# Patient Record
Sex: Female | Born: 1970 | Race: White | Hispanic: No | Smoking: Never smoker
Health system: Southern US, Community
[De-identification: ages and names within clinical notes are randomized; demographics above are authoritative.]

## PROBLEM LIST (undated history)

## (undated) DIAGNOSIS — E559 Vitamin D deficiency, unspecified: Secondary | ICD-10-CM

## (undated) DIAGNOSIS — G4710 Hypersomnia, unspecified: Secondary | ICD-10-CM

## (undated) HISTORY — DX: Hypersomnia, unspecified: G47.10

## (undated) HISTORY — DX: Vitamin D deficiency, unspecified: E55.9

## (undated) HISTORY — PX: FRACTURE SURGERY: SHX138

---

## 2012-05-13 ENCOUNTER — Ambulatory Visit: Payer: Self-pay | Admitting: Family Medicine

## 2012-05-13 VITALS — BP 108/73 | HR 71 | Temp 98.2°F | Resp 16 | Ht 63.0 in | Wt 142.0 lb

## 2012-05-13 DIAGNOSIS — Z008 Encounter for other general examination: Secondary | ICD-10-CM

## 2012-05-13 DIAGNOSIS — R5383 Other fatigue: Secondary | ICD-10-CM

## 2012-05-13 DIAGNOSIS — Z0000 Encounter for general adult medical examination without abnormal findings: Secondary | ICD-10-CM

## 2012-05-13 DIAGNOSIS — R5381 Other malaise: Secondary | ICD-10-CM

## 2012-05-13 DIAGNOSIS — Z124 Encounter for screening for malignant neoplasm of cervix: Secondary | ICD-10-CM

## 2012-05-13 LAB — COMPREHENSIVE METABOLIC PANEL
Albumin: 4.1 g/dL (ref 3.5–5.2)
BUN: 11 mg/dL (ref 6–23)
Calcium: 9.2 mg/dL (ref 8.4–10.5)
Chloride: 104 mEq/L (ref 96–112)
Glucose, Bld: 81 mg/dL (ref 70–99)
Potassium: 4.1 mEq/L (ref 3.5–5.3)

## 2012-05-13 LAB — POCT URINALYSIS DIPSTICK
Bilirubin, UA: NEGATIVE
Blood, UA: NEGATIVE
Glucose, UA: NEGATIVE
Spec Grav, UA: <=1.005

## 2012-05-13 LAB — POCT CBC
HCT, POC: 42.5 % (ref 37.7–47.9)
Hemoglobin: 13.7 g/dL (ref 12.2–16.2)
MCH, POC: 30 pg (ref 27–31.2)
MPV: 9.6 fL (ref 0–99.8)
POC MID %: 6.3 %M (ref 0–12)
RBC: 4.57 M/uL (ref 4.04–5.48)
WBC: 6.3 10*3/uL (ref 4.6–10.2)

## 2012-05-13 LAB — LIPID PANEL
HDL: 51 mg/dL (ref >39)
Triglycerides: 189 mg/dL — ABNORMAL HIGH (ref <150)

## 2012-05-13 LAB — POCT UA - MICROSCOPIC ONLY
Bacteria, U Microscopic: NEGATIVE
Casts, Ur, LPF, POC: NEGATIVE
Mucus, UA: NEGATIVE

## 2012-05-13 NOTE — Patient Instructions (Addendum)
Eat less and exercise were to try and lose weight  If fatigue persists and labs are normal, return in a few months for a reassessment.

## 2012-05-13 NOTE — Progress Notes (Signed)
Complete physical examination  Patient is here for a complete physical examination. Is been several years since she's had one. She's been having problems with persistent fatigue over the past 4 years or so. She does not know of any significant health issues.  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 Moved in with her mother and mother's husband. Her mother has been in poor health. The patient had a significant other who had a heart attack one year ago at age 16. She is also divorced before that. Regional a lot. Works about 6 hours a day. Works from home.  Review of systems: Constitutional: Persistent fatigue or so the last several years. She's gained about 30 pounds over several years, 10 in the last year. HEENT: Vision is deteriorating a little bit but otherwise she took Cardiovascular: Unremarkable Respiratory: Unremarkable GI: Unremarkable. Her stomach has gotten bigger. GU unremarkable Musculoskeletal unremarkable Dermatologic unremarkable Neurologic unremarkable  Physical examination Mildly overweight white female no acute distress. TMs normal. Eyes PERRLA. Fundi benign. Throat clear. Neck supple without nodes or thyromegaly. No carotid bruits. Chest is clear to auscultation. Heart regular without murmurs gallops or arrhythmias. Has had bilateral breast implants, with very minimal scarring. No masses are appreciated. No axillary nodes. Soft without mass or tenderness. Normal external genitalia. Cervix is benign. Pap taken. Cervix was a little bit friable and bled with Pap smear. Bimanual exam reveals no adnexal or uterine masses. Extremities unremarkable. Skin unremarkable.  Assessment: physical examination Breast implants Persistent  fatigue Weight gain Major life stresses  Plan: Will do general labs for the fatigue. Also check STD testing per her request. She is not sexually active at this time. She has no insurance but was willing for me to going on the labs. Results for orders placed in visit on 05/13/12  POCT CBC      Component Value Range   WBC 6.3  4.6 - 10.2 K/uL   Lymph, poc 2.1  0.6 - 3.4   POC LYMPH PERCENT 33.4  10 - 50 %L   MID (cbc) 0.4  0 - 0.9   POC MID % 6.3  0 - 12 %M   POC Granulocyte 3.8  2 - 6.9   Granulocyte percent 60.3  37 - 80 %G   RBC 4.57  4.04 - 5.48 M/uL   Hemoglobin 13.7  12.2 - 16.2 g/dL   HCT, POC 16.1  09.6 - 47.9 %   MCV 92.9  80 - 97 fL   MCH, POC 30.0  27 - 31.2 pg   MCHC 32.2  31.8 - 35.4 g/dL   RDW, POC 04.5     Platelet Count, POC 340  142 - 424 K/uL   MPV 9.6  0 - 99.8 fL  POCT UA - MICROSCOPIC ONLY      Component Value Range   WBC, Ur, HPF, POC neg     RBC, urine, microscopic neg     Bacteria, U Microscopic neg     Mucus, UA neg     Epithelial cells, urine per micros neg     Crystals, Ur, HPF, POC neg     Casts, Ur, LPF, POC neg     Yeast, UA neg    POCT URINALYSIS DIPSTICK  Component Value Range   Color, UA yellow     Clarity, UA clear     Glucose, UA neg     Bilirubin, UA neg     Ketones, UA neg     Spec Grav, UA <=1.005     Blood, UA neg     pH, UA 5.5     Protein, UA neg     Urobilinogen, UA 0.2     Nitrite, UA neg     Leukocytes, UA Negative    POCT GLYCOSYLATED HEMOGLOBIN (HGB A1C)      Component Value Range   Hemoglobin A1C 5.0     Will contact her with results of the remaining labs.

## 2012-05-16 ENCOUNTER — Encounter: Payer: Self-pay | Admitting: Family Medicine

## 2012-05-17 LAB — PAP IG, CT-NG, RFX HPV ASCU: Chlamydia Probe Amp: NEGATIVE

## 2012-05-18 LAB — HUMAN PAPILLOMAVIRUS, HIGH RISK: HPV DNA High Risk: NOT DETECTED

## 2012-07-29 ENCOUNTER — Ambulatory Visit: Payer: Self-pay | Admitting: Internal Medicine

## 2012-07-29 ENCOUNTER — Telehealth: Payer: Self-pay

## 2012-07-29 VITALS — BP 126/82 | HR 80 | Temp 99.0°F | Resp 16 | Ht 62.75 in | Wt 142.0 lb

## 2012-07-29 DIAGNOSIS — H9319 Tinnitus, unspecified ear: Secondary | ICD-10-CM

## 2012-07-29 DIAGNOSIS — H6120 Impacted cerumen, unspecified ear: Secondary | ICD-10-CM

## 2012-07-29 MED ORDER — NEOMYCIN-POLYMYXIN-HC 3.5-10000-1 OT SOLN: 3.0000 [drp] | Freq: Three times a day (TID) | OTIC | Status: DC

## 2012-07-29 NOTE — Telephone Encounter (Signed)
Patient states that she needs a copy of her records from Spring of 2011 and OV from 07/29/12.   Best#: [phone]

## 2012-07-31 NOTE — Progress Notes (Signed)
  Subjective:    Patient ID: Sheri Gray, female    DOB: 1971/04/11, 42 y.o.   MRN: [id number]  HPI complaining of an ear fullness/irritated feeling on the right No definite hearing loss but has a long history of tinnitus/10 at this sometimes affects her ability to fall asleep Continues to be a little fatigued/see evaluation in November with full physical examination and labs by Dr. Alwyn Ren  no allergy history   Review of Systems No new symptoms since physical examination    Objective:   Physical Exam Vital signs stable/mildly overweight Left TM obscured by wax Right TM partially extruded by wax Decreased hearing on the left compared to the right grossly  Procedure-irrigated bilaterally with success Hearing test post irrig in was within normal limits Both canals are clear except on the right there is irritation along the superior aspect of the auditory canal       Assessment & Plan:   1. Tinnitus   2. Cerumen impaction   3. Hearing loss secondary to cerumen impaction -resolved    Cortisporin Otic to the right ear for 7 days/followup if discomfort not resolved Referred to website for tinnitus 2 start fan at bedtime to try to improve sleep and perhaps to improve fatigue Followup 3 months

## 2012-08-01 NOTE — Telephone Encounter (Signed)
Left message for patient to pick up records.

## 2012-08-20 ENCOUNTER — Ambulatory Visit: Payer: Self-pay | Admitting: Internal Medicine

## 2012-08-20 ENCOUNTER — Ambulatory Visit: Payer: Self-pay

## 2012-08-20 VITALS — BP 107/65 | HR 77 | Temp 98.4°F | Resp 16 | Ht 62.5 in | Wt 143.2 lb

## 2012-08-20 DIAGNOSIS — H9209 Otalgia, unspecified ear: Secondary | ICD-10-CM

## 2012-08-20 DIAGNOSIS — H9319 Tinnitus, unspecified ear: Secondary | ICD-10-CM

## 2012-08-20 DIAGNOSIS — G4700 Insomnia, unspecified: Secondary | ICD-10-CM

## 2012-08-20 DIAGNOSIS — L309 Dermatitis, unspecified: Secondary | ICD-10-CM

## 2012-08-20 DIAGNOSIS — L259 Unspecified contact dermatitis, unspecified cause: Secondary | ICD-10-CM

## 2012-08-20 MED ORDER — ZOLPIDEM TARTRATE 10 MG PO TABS: 10.0000 mg | ORAL_TABLET | Freq: Every evening | ORAL | Status: DC | PRN

## 2012-08-20 MED ORDER — TRIAMCINOLONE 0.1 % CREAM:EUCERIN CREAM 1:1: 1.0000 "application " | TOPICAL_CREAM | Freq: Two times a day (BID) | CUTANEOUS | Status: DC

## 2012-08-20 MED ORDER — PREDNISONE 20 MG PO TABS: ORAL_TABLET | ORAL | Status: DC
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
## 2013-03-05 ENCOUNTER — Ambulatory Visit: Payer: Self-pay | Admitting: Family Medicine

## 2013-03-05 VITALS — BP 120/68 | HR 80 | Temp 99.3°F | Resp 16 | Ht 63.0 in | Wt 148.2 lb

## 2013-03-05 DIAGNOSIS — L309 Dermatitis, unspecified: Secondary | ICD-10-CM

## 2013-03-05 DIAGNOSIS — R5381 Other malaise: Secondary | ICD-10-CM

## 2013-03-05 DIAGNOSIS — L259 Unspecified contact dermatitis, unspecified cause: Secondary | ICD-10-CM

## 2013-03-05 DIAGNOSIS — R5383 Other fatigue: Secondary | ICD-10-CM

## 2013-03-05 DIAGNOSIS — G4700 Insomnia, unspecified: Secondary | ICD-10-CM

## 2013-03-05 LAB — POCT CBC
Granulocyte percent: 65.6 %G (ref 37–80)
HCT, POC: 39.8 % (ref 37.7–47.9)
Hemoglobin: 12.8 g/dL (ref 12.2–16.2)
Lymph, poc: 2.5 (ref 0.6–3.4)
MCH, POC: 30.3 pg (ref 27–31.2)
MCHC: 32.2 g/dL (ref 31.8–35.4)
MCV: 94.1 fL (ref 80–97)
MID (cbc): 0.6 (ref 0–0.9)
MPV: 9.9 fL (ref 0–99.8)
POC Granulocyte: 5.9 (ref 2–6.9)
POC LYMPH PERCENT: 27.6 %L (ref 10–50)
POC MID %: 6.8 %M (ref 0–12)
Platelet Count, POC: 275 10*3/uL (ref 142–424)
RBC: 4.23 M/uL (ref 4.04–5.48)
RDW, POC: 12.2 %
WBC: 9 10*3/uL (ref 4.6–10.2)

## 2013-03-05 MED ORDER — FLUOCINONIDE-E 0.05 % EX CREA: TOPICAL_CREAM | CUTANEOUS | Status: DC

## 2013-03-05 NOTE — Progress Notes (Signed)
  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  Often feels like she's in a fog.    Onset several years    Past Rx:  Ambien,     Past Dx tests:  CBC, TSH  Patient wants to be checked for radiation sickness.  No exposure to radiation known. Worried about heavy metal toxicity.  Uses city water Likes to read, research, spend time with dog. Nonsmoker, minimal alcohol each week  Objective:  NAD, very quiet and analytical, appropriate with good eye contact. Mildly eczematous anterior shins. HEENT:  Normal Chest:  Clear Heart:  Reg, no murmur Abdomen:  Soft, nontender, no HSM Skin:  Eczematous changes both anterior shins.  Assessment: chronic fatigue syndrome and poor sleep pattern.  Eczema. I tried to explore the possibility of depression the patient is her affect is still flat. She says that she's not depressed and she's happy. I further explored her social interactions and she said she was considering starting a group [facility]. She notes it with a group of be doing or what common interests they would have.  Eczema - Plan: fluocinonide-emollient (LIDEX-E) 0.05 % cream  Fatigue - Plan: POCT CBC, TSH, Comprehensive metabolic panel  Insomnia - Plan: POCT CBC, TSH, Comprehensive metabolic panel If all tests are negative, will consider going ahead with a sleep study to rule out OSA. Signed, Elvina Sidle, MD

## 2013-03-06 ENCOUNTER — Telehealth: Payer: Self-pay | Admitting: Radiology

## 2013-03-06 LAB — COMPREHENSIVE METABOLIC PANEL
ALT: <8 U/L (ref 0–35)
AST: 20 U/L (ref 0–37)
Albumin: 4.6 g/dL (ref 3.5–5.2)
Alkaline Phosphatase: 34 U/L — ABNORMAL LOW (ref 39–117)
BUN: 10 mg/dL (ref 6–23)
CO2: 25 mEq/L (ref 19–32)
Calcium: 9.2 mg/dL (ref 8.4–10.5)
Chloride: 103 mEq/L (ref 96–112)
Creat: 0.72 mg/dL (ref 0.50–1.10)
Glucose, Bld: 92 mg/dL (ref 70–99)
Potassium: 3.9 mEq/L (ref 3.5–5.3)
Sodium: 138 mEq/L (ref 135–145)
Total Bilirubin: 0.4 mg/dL (ref 0.3–1.2)
Total Protein: 6.9 g/dL (ref 6.0–8.3)

## 2013-03-06 LAB — TSH: TSH: 2.03 u[IU]/mL (ref 0.350–4.500)

## 2013-03-06 NOTE — Telephone Encounter (Signed)
Patient wants to know if you can add on a test for mono since she has been fatigued for several years. Please advise. Phone [phone].

## 2013-03-07 NOTE — Telephone Encounter (Signed)
Forward to Dr. Lauenstein 

## 2013-03-08 NOTE — Telephone Encounter (Signed)
Mono does not cause chronicc fatigue.  It causes acute fatigues.  The CBC actually suggests there is not a mono infection, anyway

## 2013-03-09 NOTE — Telephone Encounter (Signed)
Left message for patient to call me back. So I can advise.

## 2013-03-12 NOTE — Telephone Encounter (Signed)
Notified Pt. She may be interested in having a sleep study done soon which she discussed with Dr. Elbert Ewings in the office at her OV in August. Jenna F

## 2013-03-12 NOTE — Telephone Encounter (Signed)
LMOM to CB. 

## 2013-05-27 ENCOUNTER — Emergency Department (HOSPITAL_BASED_OUTPATIENT_CLINIC_OR_DEPARTMENT_OTHER)
Admission: EM | Admit: 2013-05-27 | Discharge: 2013-05-27 | Disposition: A | Discharge status: Home / Self Care | Payer: No Typology Code available for payment source | Attending: Emergency Medicine | Admitting: Emergency Medicine

## 2013-05-27 ENCOUNTER — Encounter (HOSPITAL_BASED_OUTPATIENT_CLINIC_OR_DEPARTMENT_OTHER): Payer: Self-pay | Admitting: Emergency Medicine

## 2013-05-27 DIAGNOSIS — Y92410 Unspecified street and highway as the place of occurrence of the external cause: Secondary | ICD-10-CM | POA: Insufficient documentation

## 2013-05-27 DIAGNOSIS — Y9389 Activity, other specified: Secondary | ICD-10-CM | POA: Insufficient documentation

## 2013-05-27 DIAGNOSIS — IMO0002 Reserved for concepts with insufficient information to code with codable children: Secondary | ICD-10-CM | POA: Insufficient documentation

## 2013-05-27 DIAGNOSIS — S161XXA Strain of muscle, fascia and tendon at neck level, initial encounter: Secondary | ICD-10-CM

## 2013-05-27 DIAGNOSIS — S139XXA Sprain of joints and ligaments of unspecified parts of neck, initial encounter: Secondary | ICD-10-CM | POA: Insufficient documentation

## 2013-05-27 MED ORDER — CYCLOBENZAPRINE HCL 10 MG PO TABS: 10.0000 mg | ORAL_TABLET | Freq: Two times a day (BID) | ORAL | Status: DC | PRN

## 2013-05-27 MED ORDER — IBUPROFEN 800 MG PO TABS: 800.0000 mg | ORAL_TABLET | Freq: Three times a day (TID) | ORAL | Status: DC
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
## 2013-05-27 NOTE — ED Provider Notes (Signed)
Medical screening examination/treatment/procedure(s) were performed by non-physician practitioner and as supervising physician I was immediately available for consultation/collaboration.  EKG Interpretation   None         Charles B. Bernette Mayers, MD 05/27/13 8119
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
## 2013-09-27 ENCOUNTER — Ambulatory Visit: Payer: BC Managed Care – PPO | Admitting: Endocrinology

## 2013-09-29 ENCOUNTER — Ambulatory Visit (INDEPENDENT_AMBULATORY_CARE_PROVIDER_SITE_OTHER): Payer: BC Managed Care – PPO | Admitting: Endocrinology

## 2013-09-29 ENCOUNTER — Encounter: Payer: Self-pay | Admitting: Endocrinology

## 2013-09-29 VITALS — BP 116/80 | HR 94 | Temp 98.2°F | Ht 63.0 in | Wt 144.0 lb

## 2013-09-29 DIAGNOSIS — R635 Abnormal weight gain: Secondary | ICD-10-CM

## 2013-09-29 DIAGNOSIS — R5383 Other fatigue: Principal | ICD-10-CM

## 2013-09-29 DIAGNOSIS — R5381 Other malaise: Secondary | ICD-10-CM

## 2013-09-29 LAB — CORTISOL
Cortisol, Plasma: 8 ug/dL
Cortisol, Plasma: 21 ug/dL

## 2013-09-29 LAB — TSH: TSH: 0.76 u[IU]/mL (ref 0.35–5.50)

## 2013-09-29 LAB — T4, FREE: Free T4: 0.71 ng/dL (ref 0.60–1.60)

## 2013-09-29 MED ORDER — COSYNTROPIN 0.25 MG IJ SOLR
0.2500 mg | Freq: Once | INTRAMUSCULAR | Status: AC
  Administered 2013-09-29: 0.25 mg via INTRAMUSCULAR

## 2013-09-29 NOTE — Patient Instructions (Signed)
blood tests are being requested for you today.  We'll contact you with results.  

## 2013-09-29 NOTE — Progress Notes (Signed)
Subjective:    Patient ID: Sheri Gray, female    DOB: [birth year], 43 y.o.   MRN: [id number]  HPI Pt states few years of slight swelling at the anterior neck, and assoc fatigue.   No past medical history on file.  Past Surgical History  Procedure Laterality Date  . Fracture surgery      History   Social History  . Marital Status: Single    Spouse Name: N/A    Number of Children: N/A  . Years of Education: N/A   Occupational History  . Geophysicist/field seismologistonline retailer    Social History Main Topics  . Smoking status: Never Smoker   . Smokeless tobacco: Never Used  . Alcohol Use: No  . Drug Use: No  . Sexual Activity: No     Comment: no sexual partners in last 12 months   Other Topics Concern  . Not on file   Social History Narrative  . No narrative on file    Current Outpatient Prescriptions on File Prior to Visit  Medication Sig Dispense Refill  . Multiple Vitamins-Minerals (MULTIVITAMIN PO) Take 1 tablet by mouth 3 (three) times daily.      . cyclobenzaprine (FLEXERIL) 10 MG tablet Take 1 tablet (10 mg total) by mouth 2 (two) times daily as needed for muscle spasms.  20 tablet  0  . fluocinonide-emollient (LIDEX-E) 0.05 % cream Apply to shins bid until itchy rough rash is gone, then use prn  30 g  3  . ibuprofen (ADVIL,MOTRIN) 800 MG tablet Take 1 tablet (800 mg total) by mouth 3 (three) times daily.  21 tablet  0  . neomycin-polymyxin-hydrocortisone (CORTISPORIN) otic solution Place 3 drops into the right ear 3 (three) times daily.  10 mL  0  . predniSONE (DELTASONE) 20 MG tablet 3/3/2/2/1/1/ single daily dose for 6 days  12 tablet  0  . Triamcinolone Acetonide (TRIAMCINOLONE 0.1 % CREAM : EUCERIN) CREA Apply 1 application topically 2 (two) times daily.  1 each  1  . zolpidem (AMBIEN) 10 MG tablet Take 1 tablet (10 mg total) by mouth at bedtime as needed for sleep.  30 tablet  1   No current facility-administered medications on file prior to visit.   No Known  Allergies  Family History  Problem Relation Age of Onset  . Skin cancer    . Cancer Father   mother has hypothyroidism.    BP 116/80  Pulse 94  Temp(Src) 98.2 F (36.8 C) (Oral)  Ht 5\' 3"  (1.6 m)  Wt 144 lb (65.318 kg)  BMI 25.51 kg/m2  SpO2 97%  Review of Systems denies depression, hair loss, sob, blurry vision, abdominal pain, rhinorrhea, and syncope.  She has weight gain, darkening of the skin under the eyes, insomnia, nasal congestion, cold intolerance, easy bruising, difficulty with concentration, acral numbness, paresthesias of the legs, constipation, and dry skin.      Objective:   Physical Exam VS: see vs page GEN: no distress HEAD: head: no deformity eyes: no periorbital swelling, no proptosis external nose and ears are normal mouth: no lesion seen NECK: supple, thyroid is not enlarged CHEST WALL: no deformity LUNGS:  Clear to auscultation CV: reg rate and rhythm, no murmur ABD: abdomen is soft, nontender.  no hepatosplenomegaly.  not distended.  no hernia MUSCULOSKELETAL: muscle bulk and strength are grossly normal.  no obvious joint swelling.  gait is normal and steady EXTEMITIES: no deformity.  no ulcer on the feet.  feet are of  normal color and temp.  no edema PULSES: dorsalis pedis intact bilat.  no carotid bruit NEURO:  cn 2-12 grossly intact.   readily moves all 4's.  sensation is intact to touch on the feet SKIN:  Normal texture and temperature.  No rash or suspicious lesion is visible.   NODES:  None palpable at the neck PSYCH: alert, well-oriented.  Does not appear anxious nor depressed.    acth stimulation test is done: baseline cortisol level=8 then cosyntropin 250 mcg is given im 45 minutes later, cortisol level=21 (normal response)  Lab Results  Component Value Date   TSH 0.76 09/29/2013       Assessment & Plan:  Neck swelling: no thyroid cause is found Fatigue: not adrenal-related Weight gain: no endocrine cause is found.

## 2014-06-09 ENCOUNTER — Ambulatory Visit (INDEPENDENT_AMBULATORY_CARE_PROVIDER_SITE_OTHER): Payer: BC Managed Care – PPO | Admitting: Emergency Medicine

## 2014-06-09 VITALS — BP 110/70 | HR 76 | Temp 98.1°F | Resp 16 | Ht 63.5 in | Wt 137.8 lb

## 2014-06-09 DIAGNOSIS — G4719 Other hypersomnia: Secondary | ICD-10-CM

## 2014-06-09 DIAGNOSIS — G4700 Insomnia, unspecified: Secondary | ICD-10-CM

## 2014-06-09 DIAGNOSIS — H9311 Tinnitus, right ear: Secondary | ICD-10-CM

## 2014-06-09 DIAGNOSIS — R5382 Chronic fatigue, unspecified: Secondary | ICD-10-CM

## 2014-06-09 LAB — VITAMIN B12: VITAMIN B 12: 1016 pg/mL — AB (ref 211–911)

## 2014-06-09 LAB — FOLATE

## 2014-06-09 NOTE — Patient Instructions (Signed)
Sleep Apnea  Sleep apnea is a sleep disorder characterized by abnormal pauses in breathing while you sleep. When your breathing pauses, the level of oxygen in your blood decreases. This causes you to move out of deep sleep and into light sleep. As a result, your quality of sleep is poor, and the system that carries your blood throughout your body (cardiovascular system) experiences stress. If sleep apnea remains untreated, the following conditions can develop:  High blood pressure (hypertension).  Coronary artery disease.  Inability to achieve or maintain an erection (impotence).  Impairment of your thought process (cognitive dysfunction). There are three types of sleep apnea: 1. Obstructive sleep apnea--Pauses in breathing during sleep because of a blocked airway. 2. Central sleep apnea--Pauses in breathing during sleep because the area of the brain that controls your breathing does not send the correct signals to the muscles that control breathing. 3. Mixed sleep apnea--A combination of both obstructive and central sleep apnea. RISK FACTORS The following risk factors can increase your risk of developing sleep apnea:  Being overweight.  Smoking.  Having narrow passages in your nose and throat.  Being of older age.  Being female.  Alcohol use.  Sedative and tranquilizer use.  Ethnicity. Among individuals younger than 35 years, African Americans are at increased risk of sleep apnea. SYMPTOMS   Difficulty staying asleep.  Daytime sleepiness and fatigue.  Loss of energy.  Irritability.  Loud, heavy snoring.  Morning headaches.  Trouble concentrating.  Forgetfulness.  Decreased interest in sex. DIAGNOSIS  In order to diagnose sleep apnea, your caregiver will perform a physical examination. Your caregiver may suggest that you take a home sleep test. Your caregiver may also recommend that you spend the night in a sleep lab. In the sleep lab, several monitors record  information about your heart, lungs, and brain while you sleep. Your leg and arm movements and blood oxygen level are also recorded. TREATMENT The following actions may help to resolve mild sleep apnea:  Sleeping on your side.   Using a decongestant if you have nasal congestion.   Avoiding the use of depressants, including alcohol, sedatives, and narcotics.   Losing weight and modifying your diet if you are overweight. There also are devices and treatments to help open your airway:  Oral appliances. These are custom-made mouthpieces that shift your lower jaw forward and slightly open your bite. This opens your airway.  Devices that create positive airway pressure. This positive pressure "splints" your airway open to help you breathe better during sleep. The following devices create positive airway pressure:  Continuous positive airway pressure (CPAP) device. The CPAP device creates a continuous level of air pressure with an air pump. The air is delivered to your airway through a mask while you sleep. This continuous pressure keeps your airway open.  Nasal expiratory positive airway pressure (EPAP) device. The EPAP device creates positive air pressure as you exhale. The device consists of single-use valves, which are inserted into each nostril and held in place by adhesive. The valves create very little resistance when you inhale but create much more resistance when you exhale. That increased resistance creates the positive airway pressure. This positive pressure while you exhale keeps your airway open, making it easier to breath when you inhale again.  Bilevel positive airway pressure (BPAP) device. The BPAP device is used mainly in patients with central sleep apnea. This device is similar to the CPAP device because it also uses an air pump to deliver continuous air pressure   through a mask. However, with the BPAP machine, the pressure is set at two different levels. The pressure when you  exhale is lower than the pressure when you inhale.  Surgery. Typically, surgery is only done if you cannot comply with less invasive treatments or if the less invasive treatments do not improve your condition. Surgery involves removing excess tissue in your airway to create a wider passage way. Document Released: 06/12/2002 Document Revised: 10/17/2012 Document Reviewed: 10/29/2011 [employer] Patient Information 2015 [employer]. This information is not intended to replace advice given to you by your health care provider. Make sure you discuss any questions you have with your health care provider.  

## 2014-06-09 NOTE — Progress Notes (Signed)
[facility] [address], [place] [id number] [phone]  Date:  06/09/2014   Name:  Sheri Gray   DOB:  1971-05-05   MRN:  [id number]  PCP:  No PCP Per Patient    Chief Complaint: Follow-up; Ear Pain; Fatigue; Headache; Insomnia; Blurred Vision; and Other   History of Present Illness:  Sheri Ghazingela D Steger is a 43 y.o. very pleasant female patient who presents with the following:  Concerned about lack of energy and fatigue that has been a chronic problem dating back years.  Worked up with labs and all in past were normal. Says she is experiencing interrupted sleep at night and this is a chronic problem.  She was recommended to under a sleep study but refused. Has lack of energy and is ready to go to sleep at 1800.  No trouble falling asleep but staying asleep is her problem.  She often has fatigue in the AM and has excessive daytime sleepiness. Her adderal does not impact her level of energy. She denies depression and claims to have no external stresses. Has tinnitus in right ear and requests an MRI Wants "nurtient levels" done. No improvement with over the counter medications or other home remedies. Denies other complaint or health concern today.   Patient Active Problem List   Diagnosis Date Noted  . Other malaise and fatigue 09/29/2013  . Weight gain 09/29/2013    History reviewed. No pertinent past medical history.  Past Surgical History  Procedure Laterality Date  . Fracture surgery      History  Substance Use Topics  . Smoking status: Never Smoker   . Smokeless tobacco: Never Used  . Alcohol Use: No    Family History  Problem Relation Age of Onset  . Skin cancer    . Cancer Father     No Known Allergies  Medication list has been reviewed and updated.  Current Outpatient Prescriptions on File Prior to Visit  Medication Sig Dispense Refill  . amphetamine-dextroamphetamine (ADDERALL XR) 30 MG 24 hr capsule Take 30 mg by mouth  daily. 1 tab per day.    . magnesium 30 MG tablet Take 30 mg by mouth 2 (two) times daily.    . Multiple Vitamins-Minerals (MULTIVITAMIN PO) Take 1 tablet by mouth 3 (three) times daily.     No current facility-administered medications on file prior to visit.    Review of Systems:  As per HPI, otherwise negative.    Physical Examination: Filed Vitals:   06/09/14 1400  BP: 110/70  Pulse: 76  Temp: 98.1 F (36.7 C)  Resp: 16   Filed Vitals:   06/09/14 1400  Height: 5' 3.5" (1.613 m)  Weight: 137 lb 12.8 oz (62.506 kg)   Body mass index is 24.02 kg/(m^2). Ideal Body Weight: Weight in (lb) to have BMI = 25: 143.1  GEN: WDWN, NAD, Non-toxic, A & O x 3 HEENT: Atraumatic, Normocephalic. Neck supple. No masses, No LAD. Ears and Nose: No external deformity.  Valsalva normal bilaterally CV: RRR, No M/G/R. No JVD. No thrill. No extra heart sounds. PULM: CTA B, no wheezes, crackles, rhonchi. No retractions. No resp. distress. No accessory muscle use. ABD: S, NT, ND, +BS. No rebound. No HSM. EXTR: No c/c/e NEURO Normal gait.  PSYCH: Normally interactive. Conversant. Has a depressed affect.  Calm demeanor.    Assessment and Plan: Sleep apnea Tinnitus right ear ENT consult Sleep study Neurology Chronic fatigue  Signed,  Phillips OdorJeffery Anderson, MD

## 2014-06-11 LAB — VITAMIN D 25 HYDROXY (VIT D DEFICIENCY, FRACTURES): Vit D, 25-Hydroxy: 25 ng/mL — ABNORMAL LOW (ref 30–100)

## 2014-06-12 ENCOUNTER — Encounter: Payer: Self-pay | Admitting: Emergency Medicine

## 2014-07-31 ENCOUNTER — Ambulatory Visit (INDEPENDENT_AMBULATORY_CARE_PROVIDER_SITE_OTHER): Payer: BLUE CROSS/BLUE SHIELD | Admitting: Neurology

## 2014-07-31 ENCOUNTER — Encounter: Payer: Self-pay | Admitting: Neurology

## 2014-07-31 VITALS — BP 106/80 | HR 88 | Resp 12 | Ht 62.75 in | Wt 137.5 lb

## 2014-07-31 DIAGNOSIS — R6889 Other general symptoms and signs: Secondary | ICD-10-CM | POA: Diagnosis not present

## 2014-07-31 DIAGNOSIS — G933 Postviral fatigue syndrome: Secondary | ICD-10-CM | POA: Diagnosis not present

## 2014-07-31 DIAGNOSIS — F5101 Primary insomnia: Secondary | ICD-10-CM | POA: Diagnosis not present

## 2014-07-31 DIAGNOSIS — R0683 Snoring: Secondary | ICD-10-CM | POA: Diagnosis not present

## 2014-07-31 DIAGNOSIS — H9311 Tinnitus, right ear: Secondary | ICD-10-CM | POA: Diagnosis not present

## 2014-07-31 DIAGNOSIS — G4710 Hypersomnia, unspecified: Secondary | ICD-10-CM | POA: Diagnosis not present

## 2014-07-31 DIAGNOSIS — G9331 Postviral fatigue syndrome: Secondary | ICD-10-CM | POA: Insufficient documentation

## 2014-07-31 NOTE — Patient Instructions (Signed)
Insomnia Insomnia is frequent trouble falling and/or staying asleep. Insomnia can be a long term problem or a short term problem. Both are common. Insomnia can be a short term problem when the wakefulness is related to a certain stress or worry. Long term insomnia is often related to ongoing stress during waking hours and/or poor sleeping habits. Overtime, sleep deprivation itself can make the problem worse. Every little thing feels more severe because you are overtired and your ability to cope is decreased. CAUSES   Stress, anxiety, and depression.  Poor sleeping habits.  Distractions such as TV in the bedroom.  Naps close to bedtime.  Engaging in emotionally charged conversations before bed.  Technical reading before sleep.  Alcohol and other sedatives. They may make the problem worse. They can hurt normal sleep patterns and normal dream activity.  Stimulants such as caffeine for several hours prior to bedtime.  Pain syndromes and shortness of breath can cause insomnia.  Exercise late at night.  Changing time zones may cause sleeping problems (jet lag). It is sometimes helpful to have someone observe your sleeping patterns. They should look for periods of not breathing during the night (sleep apnea). They should also look to see how long those periods last. If you live alone or observers are uncertain, you can also be observed at a sleep clinic where your sleep patterns will be professionally monitored. Sleep apnea requires a checkup and treatment. Give your caregivers your medical history. Give your caregivers observations your family has made about your sleep.  SYMPTOMS   Not feeling rested in the morning.  Anxiety and restlessness at bedtime.  Difficulty falling and staying asleep. TREATMENT   Your caregiver may prescribe treatment for an underlying medical disorders. Your caregiver can give advice or help if you are using alcohol or other drugs for self-medication. Treatment  of underlying problems will usually eliminate insomnia problems.  Medications can be prescribed for short time use. They are generally not recommended for lengthy use.  Over-the-counter sleep medicines are not recommended for lengthy use. They can be habit forming.  You can promote easier sleeping by making lifestyle changes such as:  Using relaxation techniques that help with breathing and reduce muscle tension.  Exercising earlier in the day.  Changing your diet and the time of your last meal. No night time snacks.  Establish a regular time to go to bed.  Counseling can help with stressful problems and worry.  Soothing music and white noise may be helpful if there are background noises you cannot remove.  Stop tedious detailed work at least one hour before bedtime. HOME CARE INSTRUCTIONS   Keep a diary. Inform your caregiver about your progress. This includes any medication side effects. See your caregiver regularly. Take note of:  Times when you are asleep.  Times when you are awake during the night.  The quality of your sleep.  How you feel the next day. This information will help your caregiver care for you.  Get out of bed if you are still awake after 15 minutes. Read or do some quiet activity. Keep the lights down. Wait until you feel sleepy and go back to bed.  Keep regular sleeping and waking hours. Avoid naps.  Exercise regularly.  Avoid distractions at bedtime. Distractions include watching television or engaging in any intense or detailed activity like attempting to balance the household checkbook.  Develop a bedtime ritual. Keep a familiar routine of bathing, brushing your teeth, climbing into bed at the same   time each night, listening to soothing music. Routines increase the success of falling to sleep faster.  Use relaxation techniques. This can be using breathing and muscle tension release routines. It can also include visualizing peaceful scenes. You can  also help control troubling or intruding thoughts by keeping your mind occupied with boring or repetitive thoughts like the old concept of counting sheep. You can make it more creative like imagining planting one beautiful flower after another in your backyard garden.  During your day, work to eliminate stress. When this is not possible use some of the previous suggestions to help reduce the anxiety that accompanies stressful situations. MAKE SURE YOU:   Understand these instructions.  Will watch your condition.  Will get help right away if you are not doing well or get worse. Document Released: 06/19/2000 Document Revised: 09/14/2011 Document Reviewed: 07/20/2007 ExitCare Patient Information 2015 [employer]. This information is not intended to replace advice given to you by your health care provider. Make sure you discuss any questions you have with your health care provider.  

## 2014-07-31 NOTE — Addendum Note (Signed)
Addended by: Melvyn NovasHMEIER, Berdia Lachman on: 07/31/2014 02:29 PM   Modules accepted: Orders

## 2014-07-31 NOTE — Progress Notes (Signed)
SLEEP MEDICINE CLINIC   Provider:  Melvyn Novas, M D  Referring Provider: Carmelina Dane, MD Primary Care Physician:    Chief Complaint  Patient presents with  . NP Anderson Sleep Consult    Rm 11, alone    HPI:  Sheri Gray is a 44 y.o.  caucasian, right handed , divorced   female , seen here as a referral  from Dr. Dareen Piano for a sleep evaluation.  The patient reports that for about 7 years there have been difficulties for her to sleep through the night. She is usually able to fall asleep initially she cannot maintain sleep. She wakes frequently and often stays awake for a long period of time between sleep.  She has responded with a high degree of fatigue and daytime and also reports some excessive daytime sleepiness. She has tried over-the-counter remedies to help her sleep through the night , she currently takes Valerian and melatonin . She also is on vitamin D supplements and multi minerals multivitamins, magnesium and takes Adderall XR 30 mg which is a usual dose for an adult.  The patient has been prescribed Adderall for attention and concentration deficits. She only takes it prn ,  once or twice a week and noted no interference with her sleep duration.    The patient reports that she usually goes to bed around 10 PM plus minus one hour, and that it is not that difficult to initiate the first sleep. She has on some nights noted a pin and needle dysesthesias more often in the legs and in her hands or arms, but these are not a frequent occurrence. She wakes up from a tinnitus, a sound of buzzing , not high pitched, and not pulsating. Its a constant sound. Often she wakes up after only an hour of sleep and then has trouble to re-initiate sleep. She usually wakes up before midnight. She is not sure how many hours of sleep overnight she truly gets, but her sleep is very fragmented. She has to arise in the morning at 8:30 AM and often has difficulties leaving the bed feeling  exhausted not restored and not refreshed from her sleep. She seems to get the sound asleep towards the morning hours. She considered herself more of a morning person years ago but since her sleep difficulties begun she has trouble to rise in the morning or to wake up before it is time to leave. She uses an alarm to wake her. Mrs. Eble states that she does usually not consummate any caffeine in the morning, she works from home so she has no commute. Her office at home has lots of daylight , a window. She doesn't nap , but craves to rest. She has inadvertently fallen asleep when work by working on her computer when she is neither physically or mentally very stimulated at the time. Those naps may last up to 3 hours, but they're not necessarily more refreshing than her nocturnal sleep. She is not in the habit of taking power naps. She has not noticed that it longer daytime nap detracts from her quality of sleep at night.  The patient reports that her mother has sleep apnea and has sometimes had problems with sleep quality. She's not aware of anybody having chronic insomnia issues.  The patient recalls that around the time that her sleep issues begun she had to get a tetanus vaccination after she injured herself on a package. She was concerned that the cortex over mercury component of  the tetanus vaccine could have caused her sleep pattern to degenerate. Her boy fried has not noted apnea, PLMs and only mild snoring.      Review of Systems: Out of a complete 14 system review, the patient complains of only the following symptoms, and all other reviewed systems are negative. Insomnia, daytime sleepiness. She has noted swollen lymph nodes, some speech difficulties or word finding difficulties, agitation anxiety, neck stiffness the feeling of a swollen or bloated abdomen of cold intolerance, blurred vision, and tinnitus or ringing in the ears. She mainly feels very fatigued.  She has ear pain and tinnitus on  the right side only, disturbing her sleep.   Epworth score 12, Fatigue severity score 63  points , denies depression .   History   Social History  . Marital Status: Single    Spouse Name: N/A    Number of Children: N/A  . Years of Education: some colle   Occupational History  . Geophysicist/field seismologist    Social History Main Topics  . Smoking status: Never Smoker   . Smokeless tobacco: Never Used  . Alcohol Use: No  . Drug Use: No  . Sexual Activity: No     Comment: no sexual partners in last 12 months   Other Topics Concern  . Not on file   Social History Narrative   Caffeine 2 glasses tea avg.     Family History  Problem Relation Age of Onset  . Skin cancer    . Cancer Father     Past Medical History  Diagnosis Date  . Vitamin D deficiency     Past Surgical History  Procedure Laterality Date  . Fracture surgery      Current Outpatient Prescriptions  Medication Sig Dispense Refill  . amphetamine-dextroamphetamine (ADDERALL XR) 30 MG 24 hr capsule Take 30 mg by mouth daily. 1 tab per day.    . ergocalciferol (VITAMIN D2) 50000 UNITS capsule Take 50,000 Units by mouth daily.    . magnesium 30 MG tablet Take 30 mg by mouth 2 (two) times daily.    Marland Kitchen MELATONIN PO Take 2 tablets by mouth daily.    . Multiple Vitamins-Minerals (MULTIVITAMIN PO) Take 2 tablets by mouth daily.     Marland Kitchen VALERIAN PO Take 3 tablets by mouth daily.     No current facility-administered medications for this visit.    Allergies as of 07/31/2014  . (No Known Allergies)    Vitals: BP 106/80 mmHg  Pulse 88  Resp 12  Ht 5' 2.75" (1.594 m)  Wt 137 lb 8 oz (62.37 kg)  BMI 24.55 kg/m2 Last Weight:  Wt Readings from Last 1 Encounters:  07/31/14 137 lb 8 oz (62.37 kg)       Last Height:   Ht Readings from Last 1 Encounters:  07/31/14 5' 2.75" (1.594 m)    Physical exam:  General: The patient is awake, alert and appears not in acute distress. The patient is well groomed. Head:  Normocephalic, atraumatic. Neck is supple. Mallampati 2 ,  neck circumference:14 inches  goiter , no lymph nodes were palpable. Nasal airflow unrestricted , TMJ is  Not  evident . Retrognathia is not seen.  Cardiovascular:  Regular rate and rhythm , without  murmurs or carotid bruit, and without distended neck veins. Respiratory: Lungs are clear to auscultation. Skin:  Without evidence of edema, or rash Trunk:  normal posture.  Neurologic exam : The patient is awake but visibly fatigued  oriented to place  and time.   Memory subjective  described as intact.  There is a normal attention span & concentration ability.  Speech is fluent without  dysarthria, dysphonia or aphasia. Mood and affect are appropriate.  Cranial nerves: Pupils are equal and briskly reactive to light.  Funduscopic exam without evidence of pallor or edema. Extraocular movements  in vertical and horizontal planes intact and without nystagmus. Visual fields by finger perimetry are intact. Hearing to finger rub intact. Facial sensation intact to fine touch. Facial motor strength is symmetric and tongue and uvula move midline.  Motor exam: Normal tone ,muscle bulk and symmetric ,strength in all extremities.  Sensory:  Fine touch, pinprick and vibration were normal.  Coordination: Rapid alternating movements in the fingers/hands is normal.  Finger-to-nose maneuver  normal without evidence of ataxia, dysmetria or tremor.  Gait and station: Patient walks without assistive device and is able unassisted to climb up to the exam table. Strength within normal limits. Stance is stable and normal. Tandem gait is unfragmented. Romberg testing is negative.  Deep tendon reflexes: in the upper and lower extremities are symmetric and intact. Babinski maneuver response is  downgoing.   Vit D and TSH were normal.  Anemia was not found, ferritin has not been tested.  ANA was not done.   Assessment:  After physical and neurologic  examination, review of laboratory studies, imaging, neurophysiology testing and pre-existing records, assessment is   1) severe fatigue, attributed to insomnia, and not improved on current OTC supplements. Has tried Ambien, which helped only for a week or 2 .  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 3) balance problems, vertigo - onset with postural changes but also while "just in bed " .  4) some palpitations at night . Noticeable dry skin, dry mouth and some dry eyes. Dry skin on dorsum of feet and dorsum manus.    The patient was advised of the nature of the diagnosed sleep disorder , the treatment options and risks for general a health and wellness arising from not treating the condition.  Visit duration was 45  minutes. More than 50% of the face to face time was used to discuss the differential findings possible diagnosis and treatment options. Furhter tests were ordered and explained.   Plan:  Treatment plan and additional workup :  Sheri Gray presents with an excessive high degree of fatigue as well as with daytime sleepiness. She has temporarily felt well perhaps for a week certainly not longer by trying Ambien at night and she's is fairly sure that her daytime fatigue and sleepiness is related to her nocturnal insomnia and thick, T sleep deprivation over months and years. She is taking Adderall XR in daytime not to combat fatigue but for ADD. On days where she has not used Adderall she has not noted a change in sleep pattern so I would not restrict this medication in the treatment of insomnia. I would like to order a sleep study but also investigate further her complaint of tinnitus with associated vertigo, and even orthostatic lightheadedness. By this could be every in relation to an in the ER problem it is also possible to relate to the cranial nerves. She does not have a hearing loss or hearing abnormality. The finding of dry skin almost scaly skin necessitates further to look for a  rheumatological condition which could also contribute to significant fatigue. I would like to order an a and a, sarcoid panel, and a comprehensive blood cell count with differential. The patient has  no history of endocrine disorders to her knowledge she is neither diabetic nor was she considered hypothyroid when last checked.         Sheri Mylar Justine Cossin MD  07/31/2014

## 2014-08-01 LAB — COMPREHENSIVE METABOLIC PANEL
ALBUMIN: 4.5 g/dL (ref 3.5–5.5)
ALK PHOS: 40 IU/L (ref 39–117)
ALT: 12 IU/L (ref 0–32)
AST: 23 IU/L (ref 0–40)
Albumin/Globulin Ratio: 2 (ref 1.1–2.5)
BUN / CREAT RATIO: 18 (ref 9–23)
BUN: 11 mg/dL (ref 6–24)
CO2: 25 mmol/L (ref 18–29)
Calcium: 9.3 mg/dL (ref 8.7–10.2)
Chloride: 101 mmol/L (ref 97–108)
Creatinine, Ser: 0.62 mg/dL (ref 0.57–1.00)
GFR calc Af Amer: 128 mL/min/{1.73_m2} (ref >59)
GFR calc non Af Amer: 111 mL/min/{1.73_m2} (ref >59)
GLUCOSE: 97 mg/dL (ref 65–99)
Globulin, Total: 2.2 g/dL (ref 1.5–4.5)
Potassium: 4.5 mmol/L (ref 3.5–5.2)
Sodium: 141 mmol/L (ref 134–144)
Total Bilirubin: <0.2 mg/dL (ref 0.0–1.2)
Total Protein: 6.7 g/dL (ref 6.0–8.5)

## 2014-08-01 LAB — CBC WITH DIFFERENTIAL/PLATELET
BASOS ABS: 0 10*3/uL (ref 0.0–0.2)
Basos: 1 %
EOS ABS: 0.3 10*3/uL (ref 0.0–0.4)
Eos: 4 %
HEMATOCRIT: 37.9 % (ref 34.0–46.6)
Hemoglobin: 12.8 g/dL (ref 11.1–15.9)
IMMATURE GRANS (ABS): 0 10*3/uL (ref 0.0–0.1)
Immature Granulocytes: 0 %
Lymphocytes Absolute: 2 10*3/uL (ref 0.7–3.1)
Lymphs: 29 %
MCH: 30.3 pg (ref 26.6–33.0)
MCHC: 33.8 g/dL (ref 31.5–35.7)
MCV: 90 fL (ref 79–97)
Monocytes Absolute: 0.5 10*3/uL (ref 0.1–0.9)
Monocytes: 7 %
NEUTROS ABS: 4.1 10*3/uL (ref 1.4–7.0)
NEUTROS PCT: 59 %
Platelets: 356 10*3/uL (ref 150–379)
RBC: 4.23 x10E6/uL (ref 3.77–5.28)
RDW: 13.3 % (ref 12.3–15.4)
WBC: 6.9 10*3/uL (ref 3.4–10.8)

## 2014-08-01 LAB — ANA W/REFLEX IF POSITIVE: ANA: NEGATIVE

## 2014-08-01 LAB — C-REACTIVE PROTEIN: CRP: 0.8 mg/L (ref 0.0–4.9)

## 2014-08-01 LAB — TSH: TSH: 3.23 u[IU]/mL (ref 0.450–4.500)

## 2014-08-01 LAB — FERRITIN: FERRITIN: 124 ng/mL (ref 15–150)

## 2014-08-03 LAB — SPECIMEN STATUS REPORT

## 2014-10-11 ENCOUNTER — Emergency Department (HOSPITAL_BASED_OUTPATIENT_CLINIC_OR_DEPARTMENT_OTHER): Payer: BLUE CROSS/BLUE SHIELD

## 2014-10-11 ENCOUNTER — Emergency Department (HOSPITAL_BASED_OUTPATIENT_CLINIC_OR_DEPARTMENT_OTHER)
Admission: EM | Admit: 2014-10-11 | Discharge: 2014-10-11 | Disposition: A | Discharge status: Home / Self Care | Payer: BLUE CROSS/BLUE SHIELD | Attending: Emergency Medicine | Admitting: Emergency Medicine

## 2014-10-11 ENCOUNTER — Encounter (HOSPITAL_BASED_OUTPATIENT_CLINIC_OR_DEPARTMENT_OTHER): Payer: Self-pay | Admitting: *Deleted

## 2014-10-11 DIAGNOSIS — Z8639 Personal history of other endocrine, nutritional and metabolic disease: Secondary | ICD-10-CM | POA: Diagnosis not present

## 2014-10-11 DIAGNOSIS — M25522 Pain in left elbow: Secondary | ICD-10-CM

## 2014-10-11 DIAGNOSIS — Z79899 Other long term (current) drug therapy: Secondary | ICD-10-CM | POA: Insufficient documentation

## 2014-10-11 NOTE — Discharge Instructions (Signed)
I would take 2 Aleve 2-3 times a day and if that improves the pain you should continue that for 2 weeks and then stop.   Pain of Unknown Etiology (Pain Without a Known Cause) You have come to your caregiver because of pain. Pain can occur in any part of the body. Often there is not a definite cause. If your laboratory (blood or urine) work was normal and X-rays or other studies were normal, your caregiver may treat you without knowing the cause of the pain. An example of this is the headache. Most headaches are diagnosed by taking a history. This means your caregiver asks you questions about your headaches. Your caregiver determines a treatment based on your answers. Usually testing done for headaches is normal. Often testing is not done unless there is no response to medications. Regardless of where your pain is located today, you can be given medications to make you comfortable. If no physical cause of pain can be found, most cases of pain will gradually leave as suddenly as they came.  If you have a painful condition and no reason can be found for the pain, it is important that you follow up with your caregiver. If the pain becomes worse or does not go away, it may be necessary to repeat tests and look further for a possible cause.  Only take over-the-counter or prescription medicines for pain, discomfort, or fever as directed by your caregiver.  For the protection of your privacy, test results cannot be given over the phone. Make sure you receive the results of your test. Ask how these results are to be obtained if you have not been informed. It is your responsibility to obtain your test results.  You may continue all activities unless the activities cause more pain. When the pain lessens, it is important to gradually resume normal activities. Resume activities by beginning slowly and gradually increasing the intensity and duration of the activities or exercise. During periods of severe pain, bed rest  may be helpful. Lie or sit in any position that is comfortable.  Ice used for acute (sudden) conditions may be effective. Use a large plastic bag filled with ice and wrapped in a towel. This may provide pain relief.  See your caregiver for continued problems. Your caregiver can help or refer you for exercises or physical therapy if necessary. If you were given medications for your condition, do not drive, operate machinery or power tools, or sign legal documents for 24 hours. Do not drink alcohol, take sleeping pills, or take other medications that may interfere with treatment. See your caregiver immediately if you have pain that is becoming worse and not relieved by medications. Document Released: 03/17/2001 Document Revised: 04/12/2013 Document Reviewed: 06/22/2005 [facility] Patient Information 2015 [employer], [place]. This information is not intended to replace advice given to you by your health care provider. Make sure you discuss any questions you have with your health care provider.

## 2014-10-11 NOTE — ED Notes (Signed)
Pain in her left arm for 6 months. Her inner elbow has been sore for 4 months ago. No known injury.

## 2014-10-11 NOTE — ED Provider Notes (Signed)
CSN: [id number]     Arrival date & time 10/11/14  1442 History   First MD Initiated Contact with Patient 10/11/14 1555     Chief Complaint  Patient presents with  . Arm Pain      HPI Patient presents with a six-month history of left elbow pain more prominent on the inner aspect the elbow.  Patient denies any history of trauma.  Patient currently is being evaluated by neurologist for sleep disorder but she has not had a sleep study yet.  Patient had blood work drawn at the end of January all which was negative. Past Medical History  Diagnosis Date  . Vitamin D deficiency    Past Surgical History  Procedure Laterality Date  . Fracture surgery     Family History  Problem Relation Age of Onset  . Skin cancer    . Cancer Father    History  Substance Use Topics  . Smoking status: Never Smoker   . Smokeless tobacco: Never Used  . Alcohol Use: No   OB History    No data available     Review of Systems  All other systems reviewed and are negative  Allergies  Review of patient's allergies indicates no known allergies.  Home Medications   Prior to Admission medications   Medication Sig Start Date End Date Taking? Authorizing Provider  amphetamine-dextroamphetamine (ADDERALL XR) 30 MG 24 hr capsule Take 30 mg by mouth daily. 1 tab per day.    Historical Provider, MD  ergocalciferol (VITAMIN D2) 50000 UNITS capsule Take 50,000 Units by mouth daily.    Historical Provider, MD  magnesium 30 MG tablet Take 30 mg by mouth 2 (two) times daily.    Historical Provider, MD  MELATONIN PO Take 2 tablets by mouth daily.    Historical Provider, MD  Multiple Vitamins-Minerals (MULTIVITAMIN PO) Take 2 tablets by mouth daily.     Historical Provider, MD  VALERIAN PO Take 3 tablets by mouth daily.    Historical Provider, MD   BP 121/82 mmHg  Pulse 81  Temp(Src) 98.4 F (36.9 C) (Oral)  Resp 18  Ht 5\' 4"  (1.626 m)  Wt 137 lb (62.143 kg)  BMI 23.50 kg/m2  SpO2 100%  LMP  09/24/2014 Physical Exam  Constitutional: She is oriented to person, place, and time. She appears well-developed and well-nourished. No distress.  HENT:  Head: Normocephalic and atraumatic.  Eyes: Pupils are equal, round, and reactive to light.  Neck: Normal range of motion.  Cardiovascular: Normal rate and intact distal pulses.   Pulmonary/Chest: No respiratory distress.  Abdominal: Normal appearance. She exhibits no distension.  Musculoskeletal: Normal range of motion.       Left elbow: She exhibits normal range of motion, no swelling, no effusion and no deformity. Tenderness (Medial epicondyle) found.  Neurological: She is alert and oriented to person, place, and time. No cranial nerve deficit.  Skin: Skin is warm and dry. No rash noted.  Psychiatric: She has a normal mood and affect. Her behavior is normal.  Nursing note and vitals reviewed.   ED Course  Procedures (including critical care time) Labs Review Labs Reviewed - No data to display  Imaging Review Dg Elbow Complete Left  10/11/2014   CLINICAL DATA:  Chronic left arm pain for 6 months.  EXAM: LEFT ELBOW - COMPLETE 3+ VIEW  COMPARISON:  None.  FINDINGS: There is no evidence of fracture, dislocation, or joint effusion. There is no evidence of arthropathy or other focal bone  abnormality. Soft tissues are unremarkable.  IMPRESSION: Negative.   Electronically Signed   By: Judie Petit.  Shick M.D.   On: 10/11/2014 16:39    Bedside ultrasound showed no evidence of DVT in left upper extremity  MDM   Final diagnoses:  Elbow pain, left        Nelva Nay, MD 10/11/14 1655

## 2014-10-11 NOTE — ED Notes (Signed)
Pt requested copy of xray on disc will pick up tomoorw

## 2014-11-26 ENCOUNTER — Encounter: Payer: Self-pay | Admitting: Neurology

## 2014-11-26 ENCOUNTER — Ambulatory Visit (INDEPENDENT_AMBULATORY_CARE_PROVIDER_SITE_OTHER): Payer: Self-pay | Admitting: Neurology

## 2014-11-26 ENCOUNTER — Other Ambulatory Visit: Payer: Self-pay

## 2014-11-26 VITALS — BP 116/70 | HR 76 | Resp 18 | Ht 63.78 in | Wt 142.0 lb

## 2014-11-26 DIAGNOSIS — R0683 Snoring: Secondary | ICD-10-CM

## 2014-11-26 DIAGNOSIS — R5383 Other fatigue: Secondary | ICD-10-CM

## 2014-11-26 DIAGNOSIS — G479 Sleep disorder, unspecified: Secondary | ICD-10-CM

## 2014-11-26 DIAGNOSIS — R002 Palpitations: Secondary | ICD-10-CM

## 2014-11-26 NOTE — Progress Notes (Signed)
SLEEP MEDICINE CLINIC   Provider:  Melvyn Novas, M D  Referring Provider: No ref. provider found Primary Care Physician:    Chief Complaint  Patient presents with  . Ear Pain    return pt, rm 11, alone    HPI:  Sheri Gray is a 44 y.o.  caucasian, right handed , divorced female , seen here as a revisit on 11-26-14  [facility] for a sleep evaluation.  The patient reports that for about 7 years there have been difficulties for her to sleep through the night. She is usually able to fall asleep initially she cannot maintain sleep. She wakes frequently and often stays awake for a long period of time between sleep.  She has responded with a high degree of fatigue and daytime and also reports some excessive daytime sleepiness. She has tried over-the-counter remedies to help her sleep through the night , she currently takes Valerian and melatonin . She also is on vitamin D supplements and multi minerals multivitamins, magnesium and takes Adderall XR 30 mg which is a usual dose for an adult.  The patient has been prescribed Adderall for attention and concentration deficits. She only takes it prn ,  once or twice a week and noted no interference with her sleep duration.   The patient reports that she usually goes to bed around 10 PM plus minus one hour, and that it is not that difficult to initiate the first sleep. She has on some nights noted a pin and needle dysesthesias more often in the legs and in her hands or arms, but these are not a frequent occurrence. She wakes up from a tinnitus, a sound of buzzing , not high pitched, and not pulsating. Its a constant sound. Often she wakes up after only an hour of sleep and then has trouble to re-initiate sleep. She usually wakes up before midnight. She is not sure how many hours of sleep overnight she truly gets, but her sleep is very fragmented. She has to arise in the morning at 8:30 AM and often has difficulties leaving the bed feeling  exhausted not restored and not refreshed from her sleep. She seems to get the sound asleep towards the morning hours. She considered herself more of a morning person years ago but since her sleep difficulties begun she has trouble to rise in the morning or to wake up before it is time to leave. She uses an alarm to wake her.Sheri Gray states that she does usually not consummate any caffeine in the morning, she works from home so she has no commute. Her office at home has lots of daylight , a window. She doesn't nap , but craves to rest. She has inadvertently fallen asleep when work by working on her computer when she is neither physically or mentally very stimulated at the time. Those naps may last up to 3 hours, but they're not necessarily more refreshing than her nocturnal sleep. She is not in the habit of taking power naps. She has not noticed that it longer daytime nap detracts from her quality of sleep at night.  The patient reports that her mother has sleep apnea and has sometimes had problems with sleep quality. She's not aware of anybody having chronic insomnia issues.  The patient recalls that around the time that her sleep issues begun she had to get a tetanus vaccination after she injured herself on a package. She was concerned that the cortex over mercury component of the  tetanus vaccine could have caused her sleep pattern to degenerate. Her boy fried has not noted apnea, PLMs and only mild snoring.   11-16-14 Sheri Gray is here today with similar complaints that she was noted for in her initial consultation. Due to her insurance changing some of the tests we had ordered after her last visit had not come to pass. She is still very daytime sleepiness extremely fatigued but her blood test did not reveal any abnormalities that led [place] into a direction of in's infection, inflammation, autoimmune process or metabolic abnormality. As her review of systems was endorsed for the same symptoms as last  time I was still like to have a sleep study done. The patient is a little overweight and she does have a larger neck I would like for her to undergo a home sleep test if that is possible for her current insurance coverage rather than an in lab sleep test. She still reports that she falls asleep easily but wakes up constantly throughout the night and is not sure why. It would be good to find out of the nature of these arousals his respiratory based or in an irregular heart rate.     Review of Systems: Out of a complete 14 system review, the patient complains of only the following symptoms, and all other reviewed systems are negative. Insomnia, daytime sleepiness. She has noted swollen lymph nodes, some speech difficulties or word finding difficulties, agitation anxiety, neck stiffness ,  intolerance, blurred vision, and tinnitus or ringing in the ears. She mainly feels very fatigued.  She has ear pain and constant sound- tinnitus on the right side only, disturbing her sleep.   Epworth score  14 from 12, Fatigue severity score 63 ,  63  points , denies depression.   History   Social History  . Marital Status: Single    Spouse Name: N/A  . Number of Children: N/A  . Years of Education: some colle   Occupational History  . Geophysicist/field seismologist    Social History Main Topics  . Smoking status: Never Smoker   . Smokeless tobacco: Never Used  . Alcohol Use: No  . Drug Use: No  . Sexual Activity: No     Comment: no sexual partners in last 12 months   Other Topics Concern  . Not on file   Social History Narrative   Caffeine 2 glasses tea avg.     Family History  Problem Relation Age of Onset  . Skin cancer    . Cancer Father     Past Medical History  Diagnosis Date  . Vitamin D deficiency     Past Surgical History  Procedure Laterality Date  . Fracture surgery      Current Outpatient Prescriptions  Medication Sig Dispense Refill  . amphetamine-dextroamphetamine (ADDERALL XR)  30 MG 24 hr capsule Take 30 mg by mouth daily. 1 tab per day.    . ergocalciferol (VITAMIN D2) 50000 UNITS capsule Take 50,000 Units by mouth daily.    . magnesium 30 MG tablet Take 30 mg by mouth 2 (two) times daily.    Marland Kitchen MELATONIN PO Take 2 tablets by mouth daily.    . Multiple Vitamins-Minerals (MULTIVITAMIN PO) Take 2 tablets by mouth daily.     Marland Kitchen VALERIAN PO Take 3 tablets by mouth daily.     No current facility-administered medications for this visit.    Allergies as of 11/26/2014  . (No Known Allergies)    Vitals: BP 116/70  mmHg  Pulse 76  Resp 18  Ht 5' 3.78" (1.62 m)  Wt 142 lb (64.411 kg)  BMI 24.54 kg/m2 Last Weight:  Wt Readings from Last 1 Encounters:  11/26/14 142 lb (64.411 kg)       Last Height:   Ht Readings from Last 1 Encounters:  11/26/14 5' 3.78" (1.62 m)    Physical exam:  General: The patient is awake, alert and appears not in acute distress. The patient is well groomed. Head: Normocephalic, atraumatic. Neck is supple. Mallampati 2 ,  neck circumference:14 inches  Goiter,  no lymph nodes were palpable. Nasal airflow unrestricted , TMJ is not evident . Retrognathia is not seen.  Cardiovascular: Regular rate and rhythm, without  murmurs or carotid bruit, and without distended neck veins. Respiratory: Lungs are clear to auscultation. Skin:  Without evidence of edema, or rash Trunk:  normal posture.  Neurologic exam : The patient is awake but visibly fatigued  oriented to place and time.   Memory subjective  described as intact.  There is a normal attention span & concentration ability.  Speech is fluent without  dysarthria, dysphonia or aphasia.  Mood and affect are appropriate.  Cranial nerves: Pupils are equal and briskly reactive to light. Funduscopic exam without evidence of pallor or edema. Extraocular movements in vertical and horizontal planes intact and without nystagmus. Visual fields by finger perimetry are intact. Hearing to finger rub  intact. Facial sensation intact to fine touch. Facial motor strength is symmetric and tongue and uvula move midline.  Motor exam: Normal tone,muscle bulk and symmetric ,strength in all extremities.  Sensory:  Fine touch, pinprick and vibration were normal.  Coordination: Rapid alternating movements in the fingers/hands is normal.  Finger-to-nose maneuver  normal without evidence of ataxia, dysmetria or tremor.  Gait and station: Patient walks without assistive device , Strength within normal limits. Stance is stable and normal.  Tandem gait is unfragmented. Romberg testing is negative.  Deep tendon reflexes: in the upper and lower extremities are symmetric and intact.   Vit D and TSH were normal.  Anemia was not found, ferritin has not been tested.  ANA was not done.   Assessment:  After physical and neurologic examination, review of laboratory studies, imaging, neurophysiology testing and pre-existing records, assessment is   1) severe fatigue, attributed to insomnia, and not improved on current OTC supplements. Has tried Ambien, which helped only for a week or 2 .  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 Will look at HST to see if fatigue and snoring are related to OSA.   3) some palpitations at night .   Visit duration was 30 minutes. More than 50% of the face to face time was used to discuss the differential findings possible diagnosis and treatment options. Further tests were ordered and explained.   Plan:  Treatment plan and additional workup :  Sheri Gray presents with an excessive high degree of fatigue as well as with daytime sleepiness.  Continues  Adderall ,as she has not noted a change in sleep pattern  Sheri Gray has in generally a very undergone some additional laboratory testings which came back normal glucose, AST ALT albumin count and red blood cell count, ferritin was on a 24 C-reactive protein was 0.8 TSH was 3.2. Antinuclear antibody was negative. No evidence of  autoimmune disorder.  She has undergone a detailed hearing test with audiology which showed no deficits. She is still undiagnosed, still concerned about hypothyroidism. I ordered a HST , will  need to follow up after testing with Np or me, 30 minutes. Porfirio Mylar.          Kyre Jeffries MD  11/26/2014

## 2014-11-26 NOTE — Addendum Note (Signed)
Addended by: Margo AyeLOGAN, Randal Goens L on: 11/26/2014 03:43 PM   Modules accepted: Orders

## 2014-11-26 NOTE — Patient Instructions (Signed)
Chronic Fatigue Syndrome Chronic fatigue syndrome (CFS) is a condition in which there is lasting, extreme tiredness (fatigue) that does not improve with rest. CFS affects women up to four times more often than men. If you have CFS, fatigue and other symptoms can make it hard for you to get through your day. There is no treatment or cure. You will need to work closely with your health care provider to come up with a treatment plan that works for you. CAUSES  No one knows what causes CFS. It may be triggered by a flu-like illness or by mono. Other triggers may include:  An abnormal immune system.  Low blood pressure.  Poor diet.  Physical or emotional stress. SIGNS AND SYMPTOMS The main symptom is fatigue that lasts all day, especially after physical or mental stress. Other common symptoms include:  An extreme loss of energy with no obvious cause.  Muscle or joint soreness.  Severe weakness.  Frequent headaches.  Fever.  Sore throat.  Swollen lymph glands.  Sleep is not refreshing.  Loss of concentration or memory. Less common symptoms may include:  Chills.  Night sweats.  Tingling or numbness.  Blurred vision.  Dizziness.  Sensitivity to noise or odors.  Mood swings.  Anxiety, panic attacks, and depression. Your symptoms may come and go, or you may have them all the time. DIAGNOSIS  There are no tests that can help health care providers diagnose CFS. It may take a long time for you to get a correct diagnosis. Your health care provider may need to do a number of tests to rule out other conditions that could be causing your symptoms. You may be diagnosed with CFS if:  You have fatigue that has lasted for at least six months.  Your fatigue is not relieved by rest.  Your fatigue is not caused by another condition.  Your fatigue is severe enough to interfere with work and daily activities.  You have at least four common symptoms of CFS. TREATMENT  There is no  cure for CFS at this time. The condition affects everyone differently. You will need to work with your health care provider to find the best treatment for your symptoms. Treatment may include:  Improving sleep with a regular bedtime routine.  Avoiding caffeine, alcohol, and tobacco.  Doing light exercise and stretching during the day.  Taking medicine to help you sleep.  Taking over-the-counter medicines to relieve joint or muscle pain.  Learning and practicing relaxation techniques.  Using memory aids or doing brain teasers to improve memory and concentration.  Seeing a mental health professional to evaluate and treat depression, if necessary.  Trying massage therapy, acupuncture, and movement exercises, like yoga or tai chi. HOME CARE INSTRUCTIONS Work closely with your health care provider to follow your treatment plan at home. You may need to make major lifestyle changes. If treatment does not seem to help, get a second opinion. You may get help from many health care providers, including doctors, mental health specialists, physical therapists, and rehabilitation therapists. Having the support of friends and loved ones is also important. SEEK MEDICAL CARE IF:  Your symptoms are not responding to treatment.  You are having strong feelings of anger, guilt, anxiety, or depression. Document Released: 07/30/2004 Document Revised: 11/06/2013 Document Reviewed: 05/12/2013 Exit[employer] Patient Information 2015 [employer]. This information is not intended to replace advice given to you by your health care provider. Make sure you discuss any questions you have with your health care provider.  

## 2014-11-26 NOTE — Addendum Note (Signed)
Addended by: Geronimo RunningINKINS, Dominyk Law A on: 11/26/2014 03:40 PM   Modules accepted: Orders

## 2014-11-28 LAB — WEST NILE VIRUS ANTIBODY,SERUM
West Nile Virus, IgG: NEGATIVE
West Nile Virus, IgM: NEGATIVE

## 2014-11-28 LAB — B. BURGDORFI ANTIBODIES

## 2014-12-05 NOTE — Progress Notes (Signed)
Quick Note:  Called and spoke to patient relayed labs were negative patient understood. ______

## 2014-12-31 ENCOUNTER — Ambulatory Visit: Payer: Medicaid Other | Admitting: Adult Health

## 2015-02-19 ENCOUNTER — Telehealth: Payer: Self-pay

## 2015-02-19 NOTE — Telephone Encounter (Signed)
Pt has not had her HST completed yet. Dr. Oliva Bustard instructions say to return after the HST. I was calling pt to see if she is willing to reschedule until after the HST is performed. Our sleep lab is aware to call and schedule appt for HST. Left a message asking her to call me back.

## 2015-02-19 NOTE — Telephone Encounter (Signed)
Spoke to pt. She has had trouble with her insurance and that is why she has not scheduled her HST as of yet. She wants to keep her appt with Dr. Vickey Huger on Thursday 8/18. She wants to discuss a few things with her before deciding to do the HST. Appt kept, Pt verbalized understanding.

## 2015-02-21 ENCOUNTER — Encounter: Payer: Self-pay | Admitting: Neurology

## 2015-02-21 ENCOUNTER — Ambulatory Visit (INDEPENDENT_AMBULATORY_CARE_PROVIDER_SITE_OTHER): Payer: BLUE CROSS/BLUE SHIELD | Admitting: Neurology

## 2015-02-21 VITALS — BP 122/80 | HR 88 | Resp 20 | Ht 64.0 in | Wt 138.0 lb

## 2015-02-21 DIAGNOSIS — G4700 Insomnia, unspecified: Secondary | ICD-10-CM | POA: Diagnosis not present

## 2015-02-21 DIAGNOSIS — H9311 Tinnitus, right ear: Secondary | ICD-10-CM

## 2015-02-21 DIAGNOSIS — H9201 Otalgia, right ear: Secondary | ICD-10-CM

## 2015-02-21 DIAGNOSIS — R0683 Snoring: Secondary | ICD-10-CM | POA: Diagnosis not present

## 2015-02-21 NOTE — Progress Notes (Signed)
SLEEP MEDICINE CLINIC   Provider:  Melvyn Novas, M D  Referring Provider: No ref. provider found Primary Care Physician:    Chief Complaint  Patient presents with  . Follow-up    wants to discuss whether she should have done or not, rm 11, with mother    HPI:  Sheri Gray is a 44 y.o.  caucasian, right handed and divorced female , seen here last in  revisit on 11-26-14  [facility]  Referral for a sleep evaluation).  The patient reports that for about 7 years there have been difficulties for her to sleep through the night. She is usually able to fall asleep initially she cannot maintain sleep. She wakes frequently and often stays awake for a long period of time between sleep. She has responded with a high degree of fatigue and daytime and also reports some excessive daytime sleepiness. She has tried over-the-counter remedies to help her sleep through the night , she currently takes Valerian and melatonin . She also is on vitamin D supplements and multi minerals multivitamins, magnesium and takes Adderall XR 30 mg which is a usual dose for an adult. The patient has been prescribed Adderall for attention and concentration deficits. She only takes it prn , once or twice a week and noted no interference with her sleep duration.   The patient reports that she usually goes to bed around 10 PM plus minus one hour, and that it is not that difficult to initiate the first sleep. She has on some nights noted a pin and needle dysesthesias more often in the legs and in her hands or arms, but these are not a frequent occurrence. She wakes up from a tinnitus, a sound of buzzing , not high pitched, and not pulsating. Its a constant sound. Often she wakes up after only an hour of sleep and then has trouble to re-initiate sleep. She usually wakes up before midnight. She is not sure how many hours of sleep overnight she truly gets, but her sleep is very fragmented. She has to arise in the  morning at 8:30 AM and often has difficulties leaving the bed feeling exhausted not restored and not refreshed from her sleep. She seems to get the sound asleep towards the morning hours. She considered herself more of a morning person years ago but since her sleep difficulties begun she has trouble to rise in the morning or to wake up before it is time to leave. She uses an alarm to wake her.[name] states that she does usually not consummate any caffeine in the morning, she works from home so she has no commute. Her office at home has lots of daylight , a window. She doesn't nap , but craves to rest. She has inadvertently fallen asleep when work by working on her computer when she is neither physically or mentally very stimulated at the time. Those naps may last up to 3 hours, but they're not necessarily more refreshing than her nocturnal sleep. She is not in the habit of taking power naps. She has not noticed that it longer daytime nap detracts from her quality of sleep at night. The patient reports that her mother has sleep apnea and has sometimes had problems with sleep quality. She's not aware of anybody having chronic insomnia issues.The patient recalls that around the time that her sleep issues begun she had to get a tetanus vaccination after she injured herself on a package. She was concerned that the  cortex over mercury component of the tetanus vaccine could have caused her sleep pattern to degenerate. Her boy fried has not noted apnea, PLMs and only mild snoring.   11-16-14 [name] is here today with similar complaints that she was noted for in her initial consultation. Due to her insurance changing some of the tests we had ordered after her last visit had not come to pass. She is still very daytime sleepiness extremely fatigued but her blood test did not reveal any abnormalities that led [place] into a direction of in's infection, inflammation, autoimmune process or metabolic abnormality. As her  review of systems was endorsed for the same symptoms as last time I was still like to have a sleep study done. The patient is a little overweight and she does have a larger neck I would like for her to undergo a home sleep test if that is possible for her current insurance coverage rather than an in lab sleep test. She still reports that she falls asleep easily but wakes up constantly throughout the night and is not sure why. It would be good to find out of the nature of these arousals his respiratory based or in an irregular heart rate.  [name] is seen  today upon request on 02-21-15. She reports that she was nauseated throughout the morning it was worse earlier today and now by 4 PM she is lying down on the exam table and still doesn't feel that she is comfortable moving. She reports that her right ear feels under pressure and there is deep ear pain. The nausea has led to vomiting and she has thrown up today. "It feels as if there is something inside my ear" .      Review of Systems: Out of a complete 14 system review, the patient complains of only the following symptoms, and all other reviewed systems are negative. Insomnia, daytime sleepiness. She has noted swollen lymph nodes, some speech difficulties or word finding difficulties, agitation anxiety, neck stiffness ,  intolerance, blurred vision, and tinnitus or ringing in the ears. She mainly feels very fatigued.  She has ear pain and constant sound- tinnitus on the right side only, disturbing her sleep.   Epworth score  12, Fatigue severity score 63    Social History   Social History  . Marital Status: Single    Spouse Name: N/A  . Number of Children: N/A  . Years of Education: some colle   Occupational History  . Geophysicist/field seismologist    Social History Main Topics  . Smoking status: Never Smoker   . Smokeless tobacco: Never Used  . Alcohol Use: No  . Drug Use: No  . Sexual Activity: No     Comment: no sexual partners in last 12  months   Other Topics Concern  . Not on file   Social History Narrative   Caffeine 2 glasses tea avg.     Family History  Problem Relation Age of Onset  . Skin cancer    . Cancer Father   . Obesity Mother     Past Medical History  Diagnosis Date  . Vitamin D deficiency   . Hypersomnia, persistent     Past Surgical History  Procedure Laterality Date  . Fracture surgery      Current Outpatient Prescriptions  Medication Sig Dispense Refill  . amphetamine-dextroamphetamine (ADDERALL XR) 30 MG 24 hr capsule Take 30 mg by mouth daily. 1 tab per day.    . ergocalciferol (VITAMIN D2)  50000 UNITS capsule Take 50,000 Units by mouth daily.    . magnesium 30 MG tablet Take 30 mg by mouth 2 (two) times daily.    Marland Kitchen MELATONIN PO Take 2 tablets by mouth daily.    . Multiple Vitamins-Minerals (MULTIVITAMIN PO) Take 2 tablets by mouth daily.     Marland Kitchen VALERIAN PO Take 3 tablets by mouth daily.     No current facility-administered medications for this visit.    Allergies as of 02/21/2015  . (No Known Allergies)    Vitals: BP 122/80 mmHg  Pulse 88  Resp 20  Ht 5\' 4"  (1.626 m)  Wt 138 lb (62.596 kg)  BMI 23.68 kg/m2 Last Weight:  Wt Readings from Last 1 Encounters:  02/21/15 138 lb (62.596 kg)       Last Height:   Ht Readings from Last 1 Encounters:  02/21/15 5\' 4"  (1.626 m)    Physical exam:  General: The patient is awake, alert and appears not in acute distress. The patient is well groomed. Head: Normocephalic, atraumatic. Neck is supple. Mallampati 2 ,  neck circumference:14 inches  Goiter,  no lymph nodes were palpable. Nasal airflow unrestricted , TMJ is not evident . Retrognathia is not seen.  The external ear canal has a slight bit of wax buildup on the left side but is clear on the right on the right side there is no scar tissue or clouding of the tympanic membrane seen. Hearing to finger rub remains intact. There is no lymph node swelling around it.  Cardiovascular:  Regular rate and rhythm, without  murmurs or carotid bruit, and without distended neck veins. Respiratory: Lungs are clear to auscultation. Skin:  Without evidence of edema, or rash Trunk:  normal posture.  Neurologic exam : The patient is awake but visibly fatigued  oriented to place and time.   Memory subjective  described as intact.  There is a normal attention span & concentration ability.  Speech is fluent without  dysarthria, dysphonia or aphasia.  Mood and affect are appropriate.  Cranial nerves: Pupils are equal and briskly reactive to light. Funduscopic exam without evidence of pallor or edema. Extraocular movements in vertical and horizontal planes intact and without nystagmus.  Visual fields by finger perimetry are intact. Hearing to finger rub intact. Facial sensation intact to fine touch. Facial motor strength is symmetric and tongue and uvula move midline.  Motor exam: Normal tone, muscle bulk and symmetric strength in all extremities.  Sensory:  Fine touch, pinprick and vibration were normal.  Coordination: Rapid alternating movements in the fingers/hands is normal.  Finger-to-nose maneuver  normal without evidence of ataxia, dysmetria or tremor.  Gait and station: Patient walks without assistive device- Tandem gait is unfragmented. Romberg testing is negative.  Deep tendon reflexes: in the upper and lower extremities are symmetric and intact.   Vit D and TSH were normal.  Anemia was not found, ferritin has not been tested.  ANA was not done.   Assessment:  After physical and neurologic examination, review of laboratory studies, imaging, neurophysiology testing and pre-existing records, assessment is  0) Ear pain on the right-  1) severe fatigue, attributed to insomnia, and not improved on current OTC supplements. Has tried Ambien, which helped only for a week or 2 .  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 Will look at HST to see if fatigue and snoring are related to  OSA.   3) some palpitations at night - anxiety, worried - this can be all non  organic in origin.   Visit duration was 30 minutes. More than 50% of the face to face time was used to discuss the differential findings possible diagnosis and treatment options. Further tests were ordered and explained.   Plan:  Treatment plan and additional workup :  Mrs. Clowdus presents with an excessive high degree of fatigue as well as with daytime sleepiness.  Continues  Adderall prn ,as she has not noted a change in sleep pattern  Mrs. Cirelli has in January undergone some additional laboratory testings;   normal glucose, AST ALT , cell count, ferritin , C-reactive protein , TSH , Antinuclear antibody was negative. No evidence of autoimmune disorder.  She has undergone a detailed hearing test with audiology which showed no deficits. She is still undiagnosed, still concerned about hypothyroidism.   I re-ordered a HST and a CT head to evaluate vestibular system,  inner ear , will need to follow up after testing with  me, 30 minutes.    Porfirio Mylar Maudie Shingledecker MD  02/21/2015

## 2015-03-17 ENCOUNTER — Ambulatory Visit
Admission: RE | Admit: 2015-03-17 | Discharge: 2015-03-17 | Disposition: A | Discharge status: Home / Self Care | Payer: BLUE CROSS/BLUE SHIELD | Source: Ambulatory Visit | Attending: Neurology | Admitting: Neurology

## 2015-03-17 DIAGNOSIS — H9311 Tinnitus, right ear: Secondary | ICD-10-CM | POA: Diagnosis not present

## 2015-03-17 DIAGNOSIS — H9201 Otalgia, right ear: Secondary | ICD-10-CM | POA: Diagnosis not present

## 2015-03-17 DIAGNOSIS — R0683 Snoring: Secondary | ICD-10-CM

## 2015-03-17 DIAGNOSIS — G4700 Insomnia, unspecified: Secondary | ICD-10-CM

## 2015-03-18 ENCOUNTER — Telehealth: Payer: Self-pay

## 2015-03-18 NOTE — Telephone Encounter (Signed)
-----   Message from Melvyn Novas, MD sent at 03/18/2015  3:00 PM EDT ----- Dear Mrs. Prien, This MRI of the brain did not find any abnormality to the auditory canal, in her ear or the auditory processing system. No stroke, abscess, bleed or scar tissue.

## 2015-03-18 NOTE — Telephone Encounter (Signed)
Called pt to give results of MRI. No answer, left a message asking her to call me back.

## 2015-03-26 ENCOUNTER — Ambulatory Visit (INDEPENDENT_AMBULATORY_CARE_PROVIDER_SITE_OTHER): Payer: BLUE CROSS/BLUE SHIELD | Admitting: Neurology

## 2015-03-26 DIAGNOSIS — R0683 Snoring: Secondary | ICD-10-CM

## 2015-03-26 DIAGNOSIS — G9331 Postviral fatigue syndrome: Secondary | ICD-10-CM

## 2015-03-26 DIAGNOSIS — G4710 Hypersomnia, unspecified: Secondary | ICD-10-CM

## 2015-03-26 DIAGNOSIS — G933 Postviral fatigue syndrome: Secondary | ICD-10-CM

## 2015-03-27 NOTE — Sleep Study (Signed)
Please see the scanned sleep study interpretation located in the Procedure tab within the Chart Review section. 

## 2015-04-08 ENCOUNTER — Ambulatory Visit (INDEPENDENT_AMBULATORY_CARE_PROVIDER_SITE_OTHER): Payer: BLUE CROSS/BLUE SHIELD | Admitting: Neurology

## 2015-04-08 ENCOUNTER — Encounter: Payer: Self-pay | Admitting: Neurology

## 2015-04-08 VITALS — BP 98/70 | HR 76 | Resp 20 | Ht 64.0 in | Wt 144.0 lb

## 2015-04-08 DIAGNOSIS — F5105 Insomnia due to other mental disorder: Secondary | ICD-10-CM

## 2015-04-08 DIAGNOSIS — F489 Nonpsychotic mental disorder, unspecified: Secondary | ICD-10-CM | POA: Diagnosis not present

## 2015-04-08 NOTE — Progress Notes (Signed)
SLEEP MEDICINE CLINIC   Provider:  Melvyn Novas, M D  Referring Provider: No ref. provider found Primary Care Physician:    Chief Complaint  Patient presents with  . Follow-up    sleep study, MRI results, rm 11, mother    HPI:  Sheri Gray is a 44 y.o.  caucasian, right handed and divorced female , seen here last in  revisit on 11-26-14  [facility]  Referral for a sleep evaluation).  The patient reports that for about 7 years there have been difficulties for her to sleep through the night. She is usually able to fall asleep initially she cannot maintain sleep. She wakes frequently and often stays awake for a long period of time between sleep. She has responded with a high degree of fatigue and daytime and also reports some excessive daytime sleepiness. She has tried over-the-counter remedies to help her sleep through the night , she currently takes Valerian and melatonin . She also is on vitamin D supplements and multi minerals multivitamins, magnesium and takes Adderall XR 30 mg which is a usual dose for an adult. The patient has been prescribed Adderall for attention and concentration deficits. She only takes it prn , once or twice a week and noted no interference with her sleep duration.   The patient reports that she usually goes to bed around 10 PM plus minus one hour, and that it is not that difficult to initiate the first sleep. She has on some nights noted a pin and needle dysesthesias more often in the legs and in her hands or arms, but these are not a frequent occurrence. She wakes up from a tinnitus, a sound of buzzing , not high pitched, and not pulsating. Its a constant sound. Often she wakes up after only an hour of sleep and then has trouble to re-initiate sleep. She usually wakes up before midnight. She is not sure how many hours of sleep overnight she truly gets, but her sleep is very fragmented. She has to arise in the morning at 8:30 AM and often has  difficulties leaving the bed feeling exhausted not restored and not refreshed from her sleep. She seems to get the sound asleep towards the morning hours. She considered herself more of a morning person years ago but since her sleep difficulties begun she has trouble to rise in the morning or to wake up before it is time to leave. She uses an alarm to wake her.Sheri Gray states that she does usually not consummate any caffeine in the morning, she works from home so she has no commute. Her office at home has lots of daylight , a window. She doesn't nap , but craves to rest. She has inadvertently fallen asleep when work by working on her computer when she is neither physically or mentally very stimulated at the time. Those naps may last up to 3 hours, but they're not necessarily more refreshing than her nocturnal sleep. She is not in the habit of taking power naps. She has not noticed that it longer daytime nap detracts from her quality of sleep at night. The patient reports that her mother has sleep apnea and has sometimes had problems with sleep quality. She's not aware of anybody having chronic insomnia issues.The patient recalls that around the time that her sleep issues begun she had to get a tetanus vaccination after she injured herself on a package. She was concerned that the cortex over mercury component of the tetanus  vaccine could have caused her sleep pattern to degenerate. Her boy fried has not noted apnea, PLMs and only mild snoring.   11-16-14 Sheri Gray is here today with similar complaints that she was noted for in her initial consultation. Due to her insurance changing some of the tests we had ordered after her last visit had not come to pass. She is still very daytime sleepiness extremely fatigued but her blood test did not reveal any abnormalities that led [place] into a direction of in's infection, inflammation, autoimmune process or metabolic abnormality. As her review of systems was endorsed  for the same symptoms as last time I was still like to have a sleep study done. The patient is a little overweight and she does have a larger neck I would like for her to undergo a home sleep test if that is possible for her current insurance coverage rather than an in lab sleep test. She still reports that she falls asleep easily but wakes up constantly throughout the night and is not sure why. It would be good to find out of the nature of these arousals his respiratory based or in an irregular heart rate.  Sheri Gray is seen  today upon request  on 02-21-15. She reports that she was nauseated throughout the morning it was worse earlier today and now by 4 PM she is lying down on the exam table and still doesn't feel that she is comfortable moving. She reports that her right ear feels under pressure and there is deep ear pain. The nausea has led to vomiting and she has thrown up today. "It feels as if there is something inside my ear" .  04-08-15  Meeting to discuss the recent sleep study. He patient slept poorly but had no physiological causes. She is reporting that her former spouse is setting up faked community Associations, and steals logos and addresses to elicit donations. She has been harassed through false reports about her through those organizations she reports, she is afraid of being microchip implanted.       Review of Systems: Out of a complete 14 system review, the patient complains of only the following symptoms, and all other reviewed systems are negative. Insomnia, daytime sleepiness. She has noted swollen lymph nodes, some speech difficulties or word finding difficulties, agitation anxiety, neck stiffness ,  intolerance, blurred vision, and tinnitus or ringing in the ears. She mainly feels very fatigued.  She has ear pain and constant sound- tinnitus on the right side only, disturbing her sleep.   Epworth score 12, Fatigue severity score 63    Social History   Social  History  . Marital Status: Single    Spouse Name: N/A  . Number of Children: N/A  . Years of Education: some colle   Occupational History  . Geophysicist/field seismologist    Social History Main Topics  . Smoking status: Never Smoker   . Smokeless tobacco: Never Used  . Alcohol Use: No  . Drug Use: No  . Sexual Activity: No     Comment: no sexual partners in last 12 months   Other Topics Concern  . Not on file   Social History Narrative   Caffeine 2 glasses tea avg.     Family History  Problem Relation Age of Onset  . Skin cancer    . Cancer Father   . Obesity Mother     Past Medical History  Diagnosis Date  . Vitamin D deficiency   . Hypersomnia, persistent  Past Surgical History  Procedure Laterality Date  . Fracture surgery      Current Outpatient Prescriptions  Medication Sig Dispense Refill  . amphetamine-dextroamphetamine (ADDERALL XR) 30 MG 24 hr capsule Take 30 mg by mouth daily. 1 tab per day.    . ergocalciferol (VITAMIN D2) 50000 UNITS capsule Take 50,000 Units by mouth daily.    . magnesium 30 MG tablet Take 30 mg by mouth 2 (two) times daily.    . Multiple Vitamins-Minerals (MULTIVITAMIN PO) Take 2 tablets by mouth daily.      No current facility-administered medications for this visit.    Allergies as of 04/08/2015  . (No Known Allergies)    Vitals: BP 98/70 mmHg  Pulse 76  Resp 20  Ht  (1.626 m)  Wt 144 lb (65.318 kg)  BMI 24.71 kg/m2 Last Weight:  Wt Readings from Last 1 Encounters:  04/08/15 144 lb (65.318 kg)       Last Height:   Ht Readings from Last 1 Encounters:  04/08/15  (1.626 m)    Physical exam:  General: The patient is awake, alert and appears  in acute distress. Anxious, afraid.  The patient is well groomed. Head: Normocephalic, atraumatic. Neck is supple. Mallampati 2 ,  neck circumference:14 inches  Goiter,  no lymph nodes were palpable. Nasal airflow unrestricted , TMJ is not evident . Retrognathia is not seen.    The external ear canal has a slight bit of wax buildup on the left side but is clear on the right on the right side there is no scar tissue or clouding of the tympanic membrane seen. Hearing to finger rub remains intact. There is no lymph node swelling around it.  Cardiovascular: Regular rate and rhythm, without  murmurs or carotid bruit, and without distended neck veins. Respiratory: Lungs are clear to auscultation. Skin:  Without evidence of edema, or rash Trunk:  normal posture.  Neurologic exam : The patient is awake but visibly agitated , fully oriented to place and time.   Memory subjective  described as intact.  There is a possible paranoia. attention span & concentration ability are affected, the patient was anxious.   Speech is fluent without dysarthria, dysphonia or aphasia.  Mood and affect are appropriate.  Cranial nerves: Pupils are equal and briskly reactive to light. Funduscopic exam without evidence of pallor or edema. Extraocular movements in vertical and horizontal planes intact and without nystagmus.  Visual fields by finger perimetry are intact. Hearing to finger rub intact. Facial sensation intact to fine touch. Facial motor strength is symmetric and tongue and uvula move midline. Motor exam: Normal tone, muscle bulk and symmetric strength in all extremities. Sensory:  Fine touch, pinprick and vibration were normal. Coordination: Rapid alternating movements in the fingers/hands is normal.  Finger-to-nose maneuver  normal without evidence of ataxia, dysmetria or tremor. Gait and station: Patient walks without assistive device- Tandem gait is unfragmented. Romberg testing is negative.  Deep tendon reflexes: in the upper and lower extremities are symmetric and intact.   Vit D and TSH were normal.  Anemia was not found.  Assessment:  After physical and neurologic examination, review of laboratory studies, imaging, neurophysiology testing and pre-existing records,  assessment is  0) Ear pain on the right- no anatomical correltaion on recent imaging study.  1) severe fatigue, attributed to insomnia, and not improved on current OTC supplements. Has tried Ambien, which helped only for a week or 2. PSG was without evidence of a  physiological Insomnia. 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 PSG left  fatigue and snoring unrelated to OSA.  3) some palpitations at night - anxiety, worried - functional.  This can be all non organic in origin.   Visit duration was 20 minutes. More than 50% of the face to face time was used to discuss the differential findings possible diagnosis and treatment options. Further tests were ordered and explained.   Plan:  Treatment plan and additional workup :  Sheri Gray presents with an excessive high degree of fatigue as well as with daytime sleepiness.  Continues  Adderall prn ,as she has not noted a change in sleep pattern  Sheri Gray has in January undergone some additional laboratory testings;   normal glucose, AST ALT , cell count, ferritin , C-reactive protein , TSH , Antinuclear antibody was negative. No evidence of autoimmune disorder.  She has undergone a detailed hearing test with audiology which showed no deficits. She is still undiagnosed, still concerned about hypothyroidism. She had a sleep study without any evidence of apnea, RLS or physiological sleep interruption, idiopathic insomnia.  MRI of the inner ear without any evidence of tumour or stroke.   This can be all non organic in origin.  No RV after today.  Offer referral to psychiatry for paranoia , delusions.    Porfirio Mylar Telesforo Brosnahan MD  04/08/2015

## 2015-06-07 IMAGING — DX DG ELBOW COMPLETE 3+V*L*
4 series · 4 of 4 positions shown · non-contrast
Comparison: None.

CLINICAL DATA: Chronic left arm pain for 6 months.

EXAM:
LEFT ELBOW - COMPLETE 3+ VIEW

[elbow ap]
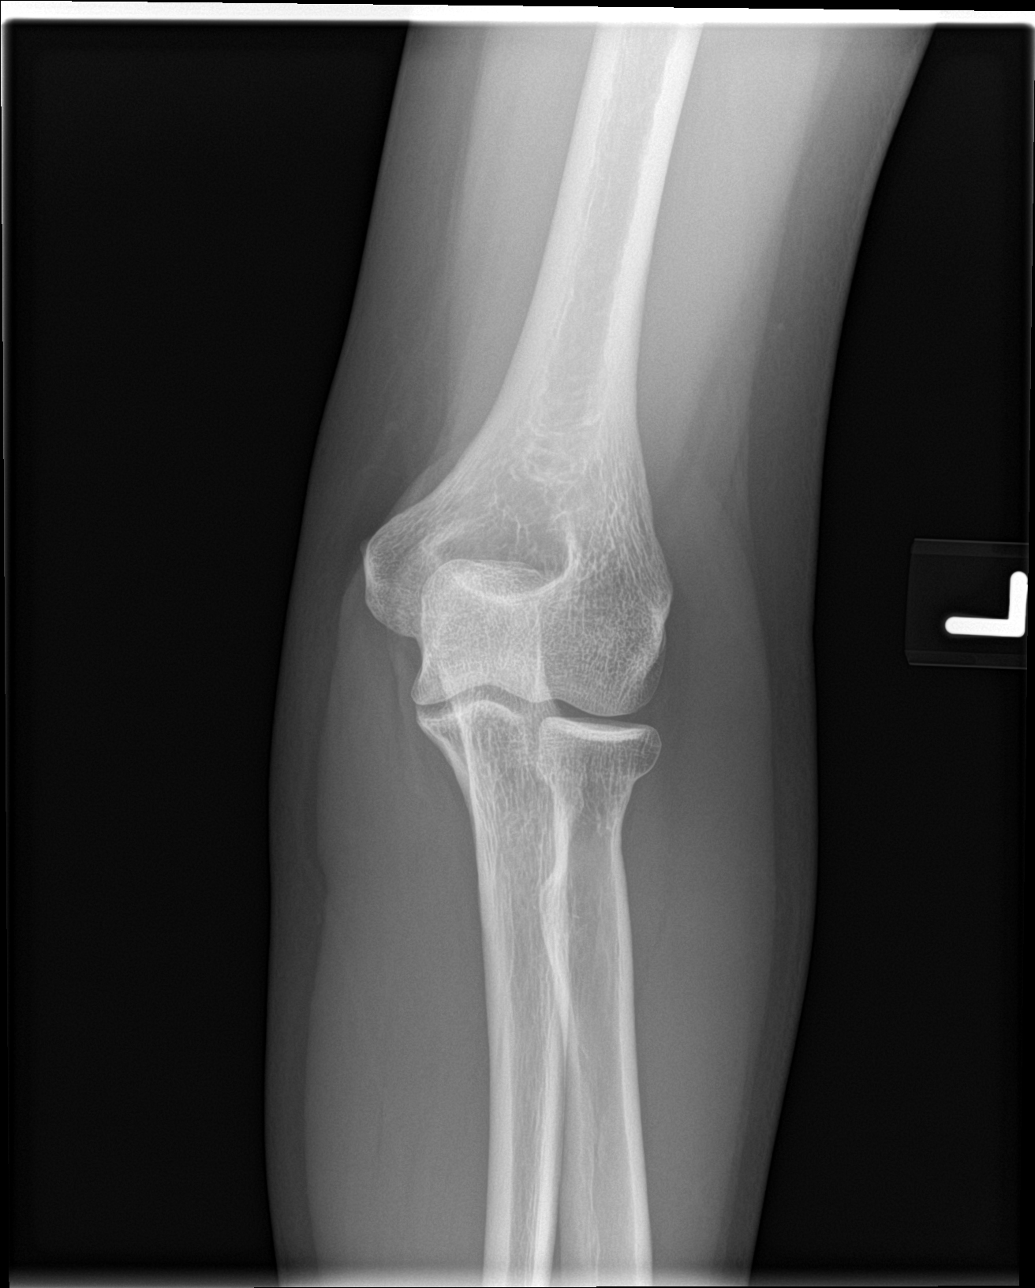

[elbow obl (1 of 2)]
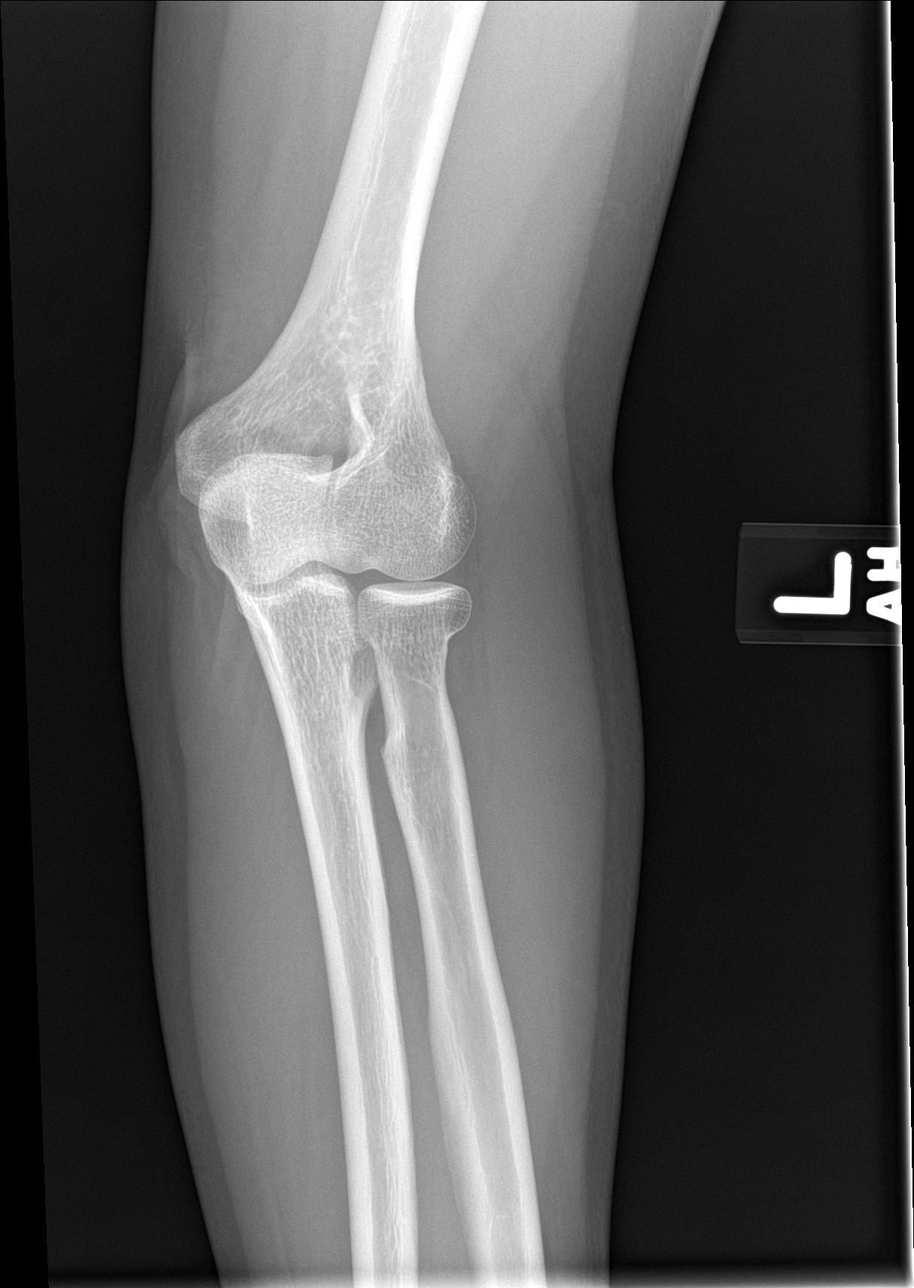

[elbow obl (2 of 2)]
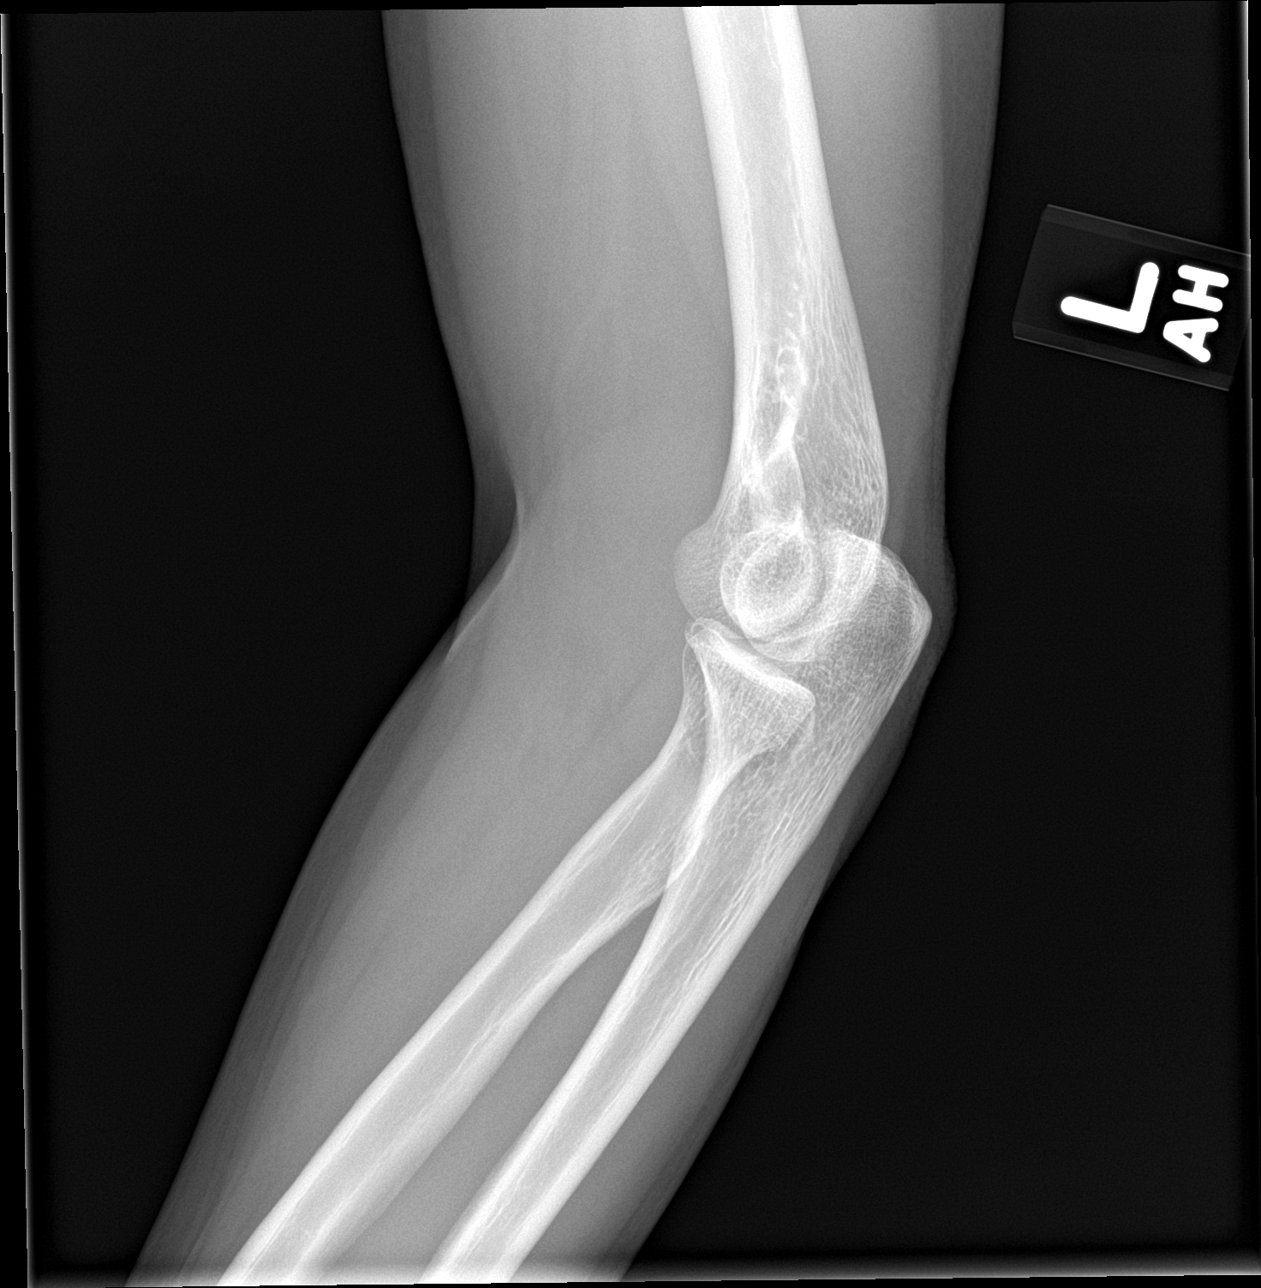

[elbow lat]
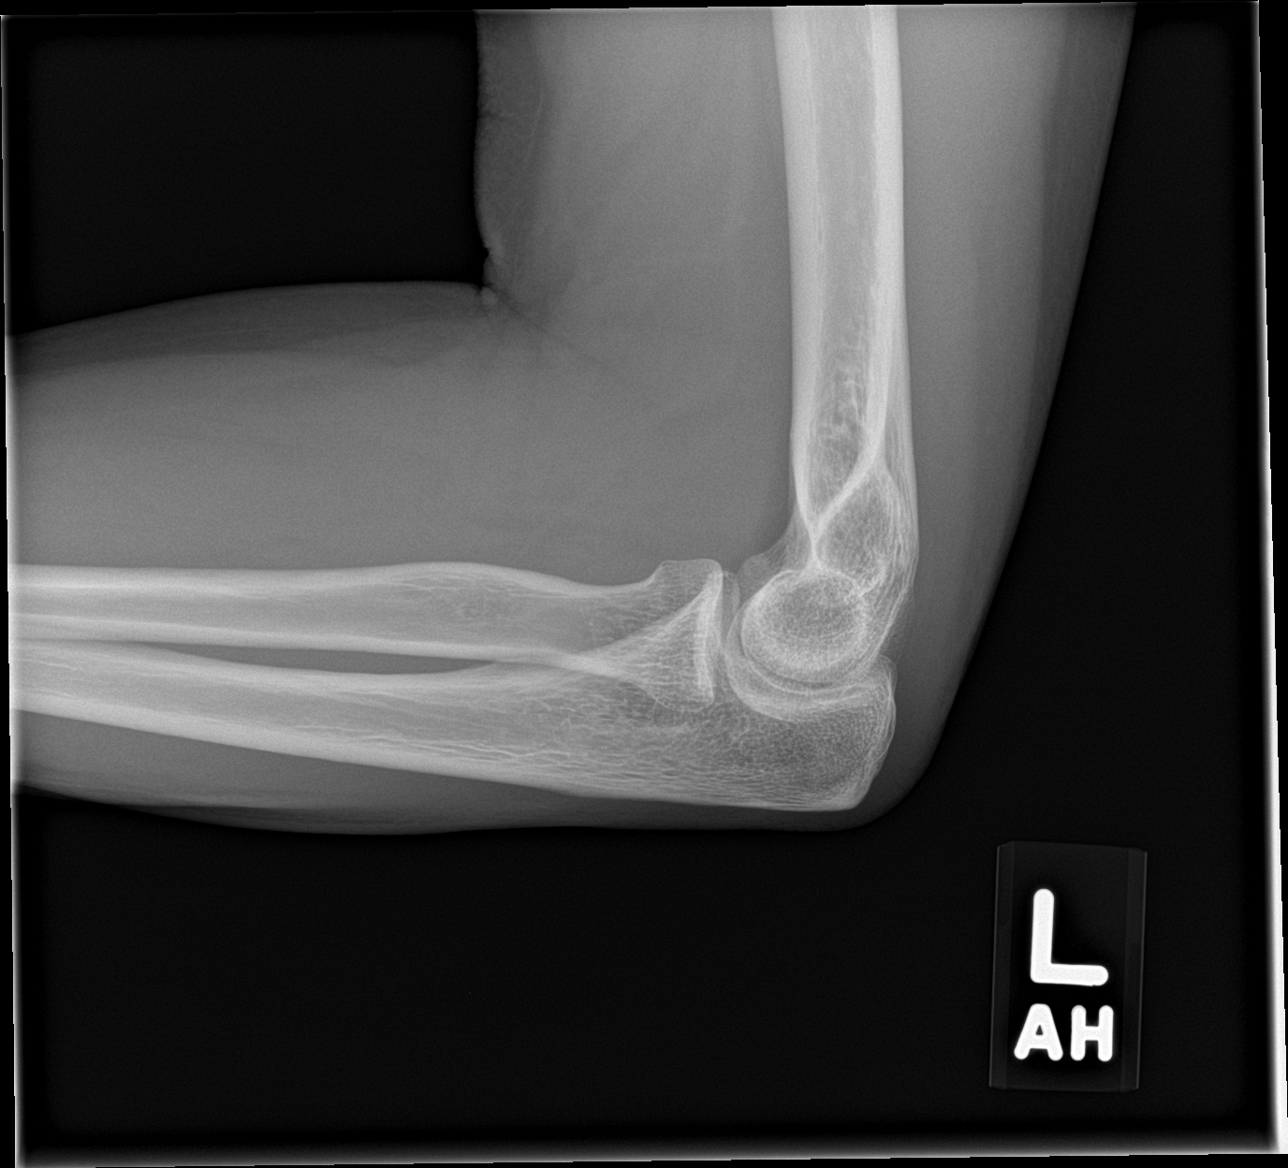

[4 of 4 positions shown; findings below may reference images not displayed]

FINDINGS: There is no evidence of fracture, dislocation, or joint effusion.
There is no evidence of arthropathy or other focal bone abnormality.
Soft tissues are unremarkable.
IMPRESSION: Negative.

## 2017-04-17 ENCOUNTER — Encounter: Payer: Self-pay | Admitting: Family Medicine

## 2017-04-17 ENCOUNTER — Ambulatory Visit (INDEPENDENT_AMBULATORY_CARE_PROVIDER_SITE_OTHER): Payer: BLUE CROSS/BLUE SHIELD | Admitting: Family Medicine

## 2017-04-17 VITALS — BP 110/80 | HR 70 | Temp 98.2°F | Resp 16 | Ht 64.0 in | Wt 143.6 lb

## 2017-04-17 DIAGNOSIS — Z0000 Encounter for general adult medical examination without abnormal findings: Secondary | ICD-10-CM

## 2017-04-17 DIAGNOSIS — G4710 Hypersomnia, unspecified: Secondary | ICD-10-CM

## 2017-04-17 DIAGNOSIS — Z131 Encounter for screening for diabetes mellitus: Secondary | ICD-10-CM | POA: Diagnosis not present

## 2017-04-17 DIAGNOSIS — Z113 Encounter for screening for infections with a predominantly sexual mode of transmission: Secondary | ICD-10-CM

## 2017-04-17 DIAGNOSIS — Z124 Encounter for screening for malignant neoplasm of cervix: Secondary | ICD-10-CM

## 2017-04-17 DIAGNOSIS — Z136 Encounter for screening for cardiovascular disorders: Secondary | ICD-10-CM | POA: Diagnosis not present

## 2017-04-17 DIAGNOSIS — F5105 Insomnia due to other mental disorder: Secondary | ICD-10-CM

## 2017-04-17 DIAGNOSIS — Z114 Encounter for screening for human immunodeficiency virus [HIV]: Secondary | ICD-10-CM | POA: Diagnosis not present

## 2017-04-17 DIAGNOSIS — Z13220 Encounter for screening for lipoid disorders: Secondary | ICD-10-CM | POA: Diagnosis not present

## 2017-04-17 LAB — POCT URINALYSIS DIP (MANUAL ENTRY)
BILIRUBIN UA: NEGATIVE mg/dL
Bilirubin, UA: NEGATIVE
Glucose, UA: NEGATIVE mg/dL
Leukocytes, UA: NEGATIVE
Nitrite, UA: NEGATIVE
Protein Ur, POC: NEGATIVE mg/dL
UROBILINOGEN UA: 0.2 U/dL
pH, UA: 5 (ref 5.0–8.0)

## 2017-04-17 NOTE — Progress Notes (Signed)
Subjective:    Patient ID: Sheri Gray, female    DOB: [birth year], 46 y.o.   MRN: [id number]  04/17/2017  Annual Exam    HPI This 46 y.o. female presents for Complete Physical examination.    Last physical:  2017 Pap smear:  2017; regular menses 3 days, no cramping, not heavy) Mammogram:  Never; refuses Colonoscopy:  none Eye exam:  None; no glasses Dental exam:  Every six months  [facility] in [employer].  Functional medicine physician/Dr. Wynema Birch in [place].  REFUSES MOST IMMUNIZATIONS NOW.   Visual Acuity Screening   Right eye Left eye Both eyes  Without correction: _0  With correction:       BP Readings from Last 3 Encounters:  04/17/17 110/80  04/08/15 98/70  02/21/15 122/80   Wt Readings from Last 3 Encounters:  04/17/17 143 lb 9.6 oz (65.1 kg)  04/08/15 144 lb (65.3 kg)  02/21/15 138 lb (62.6 kg)    There is no immunization history on file for this patient.  Review of Systems  Constitutional: Negative for activity change, appetite change, chills, diaphoresis, fatigue, fever and unexpected weight change.  HENT: Negative for congestion, dental problem, drooling, ear discharge, ear pain, facial swelling, hearing loss, mouth sores, nosebleeds, postnasal drip, rhinorrhea, sinus pressure, sneezing, sore throat, tinnitus, trouble swallowing and voice change.   Eyes: Negative for photophobia, pain, discharge, redness, itching and visual disturbance.  Respiratory: Negative for apnea, cough, choking, chest tightness, shortness of breath, wheezing and stridor.   Cardiovascular: Negative for chest pain, palpitations and leg swelling.  Gastrointestinal: Negative for abdominal distention, abdominal pain, anal bleeding, blood in stool, constipation, diarrhea, nausea, rectal pain and vomiting.  Endocrine: Negative for cold intolerance, heat intolerance, polydipsia, polyphagia and polyuria.  Genitourinary: Negative for decreased urine volume,  difficulty urinating, dyspareunia, dysuria, enuresis, flank pain, frequency, genital sores, hematuria, menstrual problem, pelvic pain, urgency, vaginal bleeding, vaginal discharge and vaginal pain.  Musculoskeletal: Negative for arthralgias, back pain, gait problem, joint swelling, myalgias, neck pain and neck stiffness.  Skin: Negative for color change, pallor, rash and wound.  Allergic/Immunologic: Negative for environmental allergies, food allergies and immunocompromised state.  Neurological: Negative for dizziness, tremors, seizures, syncope, facial asymmetry, speech difficulty, weakness, light-headedness, numbness and headaches.  Hematological: Negative for adenopathy. Does not bruise/bleed easily.  Psychiatric/Behavioral: Negative for agitation, behavioral problems, confusion, decreased concentration, dysphoric mood, hallucinations, self-injury, sleep disturbance and suicidal ideas. The patient is not nervous/anxious and is not hyperactive.        Bedtime 1000; wakes up 800.  Melatonin helpful.    Past Medical History:  Diagnosis Date  . Hypersomnia, persistent   . Vitamin D deficiency    Past Surgical History:  Procedure Laterality Date  . FRACTURE SURGERY     No Known Allergies Current Outpatient Prescriptions on File Prior to Visit  Medication Sig Dispense Refill  . amphetamine-dextroamphetamine (ADDERALL XR) 30 MG 24 hr capsule Take 30 mg by mouth daily. 1 tab per day.    . ergocalciferol (VITAMIN D2) 50000 UNITS capsule Take 50,000 Units by mouth daily.    . magnesium 30 MG tablet Take 30 mg by mouth 2 (two) times daily.    . Multiple Vitamins-Minerals (MULTIVITAMIN PO) Take 2 tablets by mouth daily.      No current facility-administered medications on file prior to visit.    Social History   Social History  . Marital status: Single    Spouse name: N/A  . Number of  children: N/A  . Years of education: some colle   Occupational History  . Psychologist, counselling    Social  History Main Topics  . Smoking status: Never Smoker  . Smokeless tobacco: Never Used  . Alcohol use No  . Drug use: No  . Sexual activity: No     Comment: no sexual partners in last 12 months   Other Topics Concern  . Not on file   Social History Narrative   Marital status: single; relationship for several years with gentleman      Children: none      Lives: with mother      Employment: business owner real estate      Tobacco; none      Alcohol: none      Drugs: none     Exercise: Insurance claims handler; recumbent bike at home      Seatbelt: 100%; no texting   Caffeine 2 glasses tea avg.    Family History  Problem Relation Age of Onset  . Cancer Father   . Obesity Mother   . Diabetes Mother   . Skin cancer Unknown        Objective:    BP 110/80   Pulse 70   Temp 98.2 F (36.8 C)   Resp 16   Ht _0  (1.626 m)   Wt 143 lb 9.6 oz (65.1 kg)   LMP 04/03/2017   SpO2 100%   BMI 24.65 kg/m  Physical Exam  Constitutional: She is oriented to person, place, and time. She appears well-developed and well-nourished. No distress.  HENT:  Head: Normocephalic and atraumatic.  Right Ear: External ear normal.  Left Ear: External ear normal.  Nose: Nose normal.  Mouth/Throat: Oropharynx is clear and moist.  Eyes: Pupils are equal, round, and reactive to light. Conjunctivae and EOM are normal.  Neck: Normal range of motion and full passive range of motion without pain. Neck supple. No JVD present. Carotid bruit is not present. No thyromegaly present.  Cardiovascular: Normal rate, regular rhythm and normal heart sounds.  Exam reveals no gallop and no friction rub.   No murmur heard. Pulmonary/Chest: Effort normal and breath sounds normal. She has no wheezes. She has no rales. Right breast exhibits no inverted nipple, no mass, no nipple discharge, no skin change and no tenderness. Left breast exhibits no inverted nipple, no mass, no nipple discharge, no skin change and no  tenderness. Breasts are symmetrical.  Abdominal: Soft. Bowel sounds are normal. She exhibits no distension and no mass. There is no tenderness. There is no rebound and no guarding.  Genitourinary: Vagina normal and uterus normal. No labial fusion. There is no rash, tenderness, lesion or injury on the right labia. There is no rash, tenderness, lesion or injury on the left labia. Cervix exhibits no motion tenderness, no discharge and no friability. Right adnexum displays no mass, no tenderness and no fullness. Left adnexum displays no mass, no tenderness and no fullness. No erythema, tenderness or bleeding in the vagina. No foreign body in the vagina. No signs of injury around the vagina. No vaginal discharge found.  Musculoskeletal:       Right shoulder: Normal.       Left shoulder: Normal.       Cervical back: Normal.  Lymphadenopathy:    She has no cervical adenopathy.  Neurological: She is alert and oriented to person, place, and time. She has normal reflexes. No cranial nerve deficit. She exhibits normal muscle tone. Coordination  normal.  Skin: Skin is warm and dry. No rash noted. She is not diaphoretic. No erythema. No pallor.  Psychiatric: She has a normal mood and affect. Her behavior is normal. Judgment and thought content normal.  Nursing note and vitals reviewed.  No results found. Depression screen PHQ 2/9 04/17/2017  Decreased Interest 0  Down, Depressed, Hopeless 0  PHQ - 2 Score 0   Fall Risk  04/17/2017  Falls in the past year? No        Assessment & Plan:   1. Routine physical examination   2. 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 Screening, lipid   5. Screening for diabetes mellitus   6. Cervical cancer screening   7. Screening for HIV (human immunodeficiency virus)   8. Screening for cardiovascular condition   9. Screening examination for STD (sexually transmitted disease)     -anticipatory guidance provided --- exercise, weight  loss, safe driving practices, safe sexual practices. -obtain age appropriate screening labs and labs for chronic disease management.   Orders Placed This Encounter  Procedures  . CBC with Differential/Platelet  . Hemoglobin A1c  . TSH  . Lipid panel  . CMP14+EGFR  . HIV antibody  . RPR  . POCT urinalysis dipstick  . EKG 12-Lead   No orders of the defined types were placed in this encounter.   Return in about 1 year (around 04/17/2018) for complete physical examiniation.   Sheri Gray Elayne Guerin, M.D. Primary Care at [facility] previously [facility] [place] [address] [place], [place]  [address] [phone] phone [phone] fax

## 2017-04-17 NOTE — Patient Instructions (Addendum)
IF you received an x-ray today, you will receive an invoice from [facility]. Please contact [facility] at [phone] with questions or concerns regarding your invoice.   IF you received labwork today, you will receive an invoice from [facility]. Please contact [facility] at [phone] with questions or concerns regarding your invoice.   Our billing staff will not be able to assist you with questions regarding bills from these companies.  You will be contacted with the lab results as soon as they are available. The fastest way to get your results is to activate your My Chart account. Instructions are located on the last page of this paperwork. If you have not heard from [place] regarding the results in 2 weeks, please contact this office.      Preventive Care 40-64 Years, Female Preventive care refers to lifestyle choices and visits with your health care provider that can promote health and wellness. What does preventive care include?  A yearly physical exam. This is also called an annual well check.  Dental exams once or twice a year.  Routine eye exams. Ask your health care provider how often you should have your eyes checked.  Personal lifestyle choices, including: ? Daily care of your teeth and gums. ? Regular physical activity. ? Eating a healthy diet. ? Avoiding tobacco and drug use. ? Limiting alcohol use. ? Practicing safe sex. ? Taking low-dose aspirin daily starting at age 34. ? Taking vitamin and mineral supplements as recommended by your health care provider. What happens during an annual well check? The services and screenings done by your health care provider during your annual well check will depend on your age, overall health, lifestyle risk factors, and family history of disease. Counseling Your health care provider may ask you questions about your:  Alcohol use.  Tobacco use.  Drug use.  Emotional well-being.  Home and relationship  well-being.  Sexual activity.  Eating habits.  Work and work Statistician.  Method of birth control.  Menstrual cycle.  Pregnancy history.  Screening You may have the following tests or measurements:  Height, weight, and BMI.  Blood pressure.  Lipid and cholesterol levels. These may be checked every 5 years, or more frequently if you are over 65 years old.  Skin check.  Lung cancer screening. You may have this screening every year starting at age 102 if you have a 30-pack-year history of smoking and currently smoke or have quit within the past 15 years.  Fecal occult blood test (FOBT) of the stool. You may have this test every year starting at age 33.  Flexible sigmoidoscopy or colonoscopy. You may have a sigmoidoscopy every 5 years or a colonoscopy every 10 years starting at age 23.  Hepatitis C blood test.  Hepatitis B blood test.  Sexually transmitted disease (STD) testing.  Diabetes screening. This is done by checking your blood sugar (glucose) after you have not eaten for a while (fasting). You may have this done every 1-3 years.  Mammogram. This may be done every 1-2 years. Talk to your health care provider about when you should start having regular mammograms. This may depend on whether you have a family history of breast cancer.  BRCA-related cancer screening. This may be done if you have a family history of breast, ovarian, tubal, or peritoneal cancers.  Pelvic exam and Pap test. This may be done every 3 years starting at age 3. Starting at age 55, this may be done every 5 years if  you have a Pap test in combination with an HPV test.  Bone density scan. This is done to screen for osteoporosis. You may have this scan if you are at high risk for osteoporosis.  Discuss your test results, treatment options, and if necessary, the need for more tests with your health care provider. Vaccines Your health care provider may recommend certain vaccines, such  as:  Influenza vaccine. This is recommended every year.  Tetanus, diphtheria, and acellular pertussis (Tdap, Td) vaccine. You may need a Td booster every 10 years.  Varicella vaccine. You may need this if you have not been vaccinated.  Zoster vaccine. You may need this after age 70.  Measles, mumps, and rubella (MMR) vaccine. You may need at least one dose of MMR if you were born in 1957 or later. You may also need a second dose.  Pneumococcal 13-valent conjugate (PCV13) vaccine. You may need this if you have certain conditions and were not previously vaccinated.  Pneumococcal polysaccharide (PPSV23) vaccine. You may need one or two doses if you smoke cigarettes or if you have certain conditions.  Meningococcal vaccine. You may need this if you have certain conditions.  Hepatitis A vaccine. You may need this if you have certain conditions or if you travel or work in places where you may be exposed to hepatitis A.  Hepatitis B vaccine. You may need this if you have certain conditions or if you travel or work in places where you may be exposed to hepatitis B.  Haemophilus influenzae type b (Hib) vaccine. You may need this if you have certain conditions.  Talk to your health care provider about which screenings and vaccines you need and how often you need them. This information is not intended to replace advice given to you by your health care provider. Make sure you discuss any questions you have with your health care provider. Document Released: 07/19/2015 Document Revised: 03/11/2016 Document Reviewed: 04/23/2015 Elsevier Interactive Patient Education  2017 [employer].

## 2017-04-18 LAB — LIPID PANEL
CHOL/HDL RATIO: 4.1 ratio (ref 0.0–4.4)
CHOLESTEROL TOTAL: 215 mg/dL — AB (ref 100–199)
HDL: 53 mg/dL (ref >39)
LDL CALC: 140 mg/dL — AB (ref 0–99)
Triglycerides: 109 mg/dL (ref 0–149)
VLDL CHOLESTEROL CAL: 22 mg/dL (ref 5–40)

## 2017-04-18 LAB — CMP14+EGFR
ALK PHOS: 35 IU/L — AB (ref 39–117)
ALT: 7 IU/L (ref 0–32)
AST: 19 IU/L (ref 0–40)
Albumin/Globulin Ratio: 1.7 (ref 1.2–2.2)
Albumin: 4.4 g/dL (ref 3.5–5.5)
BUN/Creatinine Ratio: 14 (ref 9–23)
BUN: 9 mg/dL (ref 6–24)
Bilirubin Total: 0.2 mg/dL (ref 0.0–1.2)
CO2: 24 mmol/L (ref 20–29)
CREATININE: 0.63 mg/dL (ref 0.57–1.00)
Calcium: 9.2 mg/dL (ref 8.7–10.2)
Chloride: 103 mmol/L (ref 96–106)
GFR calc Af Amer: 124 mL/min/{1.73_m2} (ref >59)
GFR calc non Af Amer: 108 mL/min/{1.73_m2} (ref >59)
Globulin, Total: 2.6 g/dL (ref 1.5–4.5)
Glucose: 96 mg/dL (ref 65–99)
POTASSIUM: 4.3 mmol/L (ref 3.5–5.2)
Sodium: 141 mmol/L (ref 134–144)
Total Protein: 7 g/dL (ref 6.0–8.5)

## 2017-04-18 LAB — CBC WITH DIFFERENTIAL/PLATELET
BASOS: 0 %
Basophils Absolute: 0 10*3/uL (ref 0.0–0.2)
EOS (ABSOLUTE): 0.3 10*3/uL (ref 0.0–0.4)
EOS: 4 %
HEMATOCRIT: 38.8 % (ref 34.0–46.6)
Hemoglobin: 13 g/dL (ref 11.1–15.9)
IMMATURE GRANS (ABS): 0 10*3/uL (ref 0.0–0.1)
Immature Granulocytes: 0 %
LYMPHS: 32 %
Lymphocytes Absolute: 1.9 10*3/uL (ref 0.7–3.1)
MCH: 30 pg (ref 26.6–33.0)
MCHC: 33.5 g/dL (ref 31.5–35.7)
MCV: 89 fL (ref 79–97)
Monocytes Absolute: 0.6 10*3/uL (ref 0.1–0.9)
Monocytes: 10 %
NEUTROS PCT: 54 %
Neutrophils Absolute: 3.1 10*3/uL (ref 1.4–7.0)
Platelets: 288 10*3/uL (ref 150–379)
RBC: 4.34 x10E6/uL (ref 3.77–5.28)
RDW: 12.9 % (ref 12.3–15.4)
WBC: 5.9 10*3/uL (ref 3.4–10.8)

## 2017-04-18 LAB — HEMOGLOBIN A1C
Est. average glucose Bld gHb Est-mCnc: 108 mg/dL
Hgb A1c MFr Bld: 5.4 % (ref 4.8–5.6)

## 2017-04-18 LAB — TSH: TSH: 2 u[IU]/mL (ref 0.450–4.500)

## 2017-04-18 LAB — HIV ANTIBODY (ROUTINE TESTING W REFLEX): HIV Screen 4th Generation wRfx: NONREACTIVE

## 2017-04-18 LAB — RPR: RPR Ser Ql: NONREACTIVE

## 2017-04-29 LAB — PAP IG, CT-NG NAA, HPV HIGH-RISK
Chlamydia, Nuc. Acid Amp: NEGATIVE
Gonococcus by Nucleic Acid Amp: NEGATIVE
HPV, HIGH-RISK: NEGATIVE
PAP SMEAR COMMENT: 0

## 2017-05-06 ENCOUNTER — Telehealth: Payer: Self-pay | Admitting: Family Medicine

## 2017-05-06 NOTE — Telephone Encounter (Signed)
Pt called and left a voicemail on medical records desk wanting someone to go over her lab results.  Please advise when they are ready.  [phone]

## 2017-05-10 NOTE — Telephone Encounter (Signed)
Pap has not resulted. I believe Sheri Gray was waiting for full results before placing notes. Do you have any ideas.

## 2017-11-25 ENCOUNTER — Encounter: Payer: Self-pay | Admitting: Family Medicine

## 2018-04-20 ENCOUNTER — Encounter: Payer: Self-pay | Admitting: Emergency Medicine

## 2018-04-20 ENCOUNTER — Other Ambulatory Visit: Payer: Self-pay

## 2018-04-20 ENCOUNTER — Emergency Department
Admission: EM | Admit: 2018-04-20 | Discharge: 2018-04-20 | Disposition: A | Discharge status: Home / Self Care | Payer: BLUE CROSS/BLUE SHIELD | Attending: Emergency Medicine | Admitting: Emergency Medicine

## 2018-04-20 DIAGNOSIS — Z79899 Other long term (current) drug therapy: Secondary | ICD-10-CM | POA: Diagnosis not present

## 2018-04-20 DIAGNOSIS — N760 Acute vaginitis: Secondary | ICD-10-CM

## 2018-04-20 DIAGNOSIS — T7621XA Adult sexual abuse, suspected, initial encounter: Secondary | ICD-10-CM | POA: Diagnosis present

## 2018-04-20 DIAGNOSIS — T7421XA Adult sexual abuse, confirmed, initial encounter: Secondary | ICD-10-CM | POA: Insufficient documentation

## 2018-04-20 DIAGNOSIS — B9689 Other specified bacterial agents as the cause of diseases classified elsewhere: Secondary | ICD-10-CM

## 2018-04-20 LAB — CHLAMYDIA/NGC RT PCR (ARMC ONLY)
Chlamydia Tr: NOT DETECTED
Chlamydia Tr: NOT DETECTED
N gonorrhoeae: NOT DETECTED
N gonorrhoeae: NOT DETECTED

## 2018-04-20 LAB — URINALYSIS, ROUTINE W REFLEX MICROSCOPIC
Bacteria, UA: NONE SEEN
Bilirubin Urine: NEGATIVE
GLUCOSE, UA: NEGATIVE mg/dL
Ketones, ur: NEGATIVE mg/dL
Leukocytes, UA: NEGATIVE
Nitrite: NEGATIVE
Protein, ur: NEGATIVE mg/dL
Specific Gravity, Urine: 1.024 (ref 1.005–1.030)
pH: 5 (ref 5.0–8.0)

## 2018-04-20 LAB — WET PREP, GENITAL
SPERM: NONE SEEN
TRICH WET PREP: NONE SEEN
YEAST WET PREP: NONE SEEN

## 2018-04-20 MED ORDER — METRONIDAZOLE 500 MG PO TABS
500.0000 mg | ORAL_TABLET | Freq: Once | ORAL | Status: AC
  Administered 2018-04-20: 500 mg via ORAL
  Filled 2018-04-20: qty 1

## 2018-04-20 MED ORDER — METRONIDAZOLE 500 MG PO TABS: 500.0000 mg | ORAL_TABLET | Freq: Two times a day (BID) | ORAL | 0 refills | Status: DC

## 2018-04-20 NOTE — Discharge Instructions (Signed)
Please follow up with your gynecologist if symptoms are not relieved with medication. Avoid alcohol while taking Flagyl. Return to the ER for symptoms that change or worsen or for new concerns.
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
## 2018-08-10 ENCOUNTER — Other Ambulatory Visit: Payer: Self-pay

## 2018-08-10 ENCOUNTER — Encounter: Payer: Self-pay | Admitting: Physician Assistant

## 2018-08-10 ENCOUNTER — Ambulatory Visit (INDEPENDENT_AMBULATORY_CARE_PROVIDER_SITE_OTHER): Payer: BLUE CROSS/BLUE SHIELD | Admitting: Physician Assistant

## 2018-08-10 VITALS — BP 105/66 | HR 91 | Temp 98.0°F | Resp 16 | Wt 128.0 lb

## 2018-08-10 DIAGNOSIS — L0291 Cutaneous abscess, unspecified: Secondary | ICD-10-CM

## 2018-08-10 MED ORDER — DOXYCYCLINE HYCLATE 100 MG PO TABS: 100.0000 mg | ORAL_TABLET | Freq: Two times a day (BID) | ORAL | 0 refills | Status: DC

## 2018-08-10 NOTE — Progress Notes (Signed)
   Sheri Gray  MRN: [id number] DOB: Jul 30, 1970  PCP: Patient, No Pcp Per  Subjective:  Pt is a 48 year old female who presents to clinic for bumps on scalp.  Endorses "draining and crusting" cycles.  She is using medicated shampoo.  She has never had this before and does not recall contacts with similar sy,mptoms.   Review of Systems  Constitutional: Negative for chills and fever.  Skin: Positive for wound.    Patient Active Problem List   Diagnosis Date Noted  . 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 Insomnia, idiopathic 07/31/2014  . Snoring 07/31/2014  . Hypersomnia, persistent 07/31/2014  . Postviral fatigue syndrome 07/31/2014  . Other malaise and fatigue 09/29/2013  . Weight gain 09/29/2013    Current Outpatient Medications on File Prior to Visit  Medication Sig Dispense Refill  . amphetamine-dextroamphetamine (ADDERALL XR) 30 MG 24 hr capsule Take 30 mg by mouth daily. 1 tab per day.    . B Complex Vitamins (VITAMIN B-COMPLEX) TABS Take by mouth.    . ergocalciferol (VITAMIN D2) 50000 UNITS capsule Take 50,000 Units by mouth daily.    . magnesium 30 MG tablet Take 30 mg by mouth 2 (two) times daily.    . Melatonin 1 MG TABS Take by mouth.    . metroNIDAZOLE (FLAGYL) 500 MG tablet Take 1 tablet (500 mg total) by mouth 2 (two) times daily. 14 tablet 0  . Multiple Vitamins-Minerals (MULTIVITAMIN PO) Take 2 tablets by mouth daily.      No current facility-administered medications on file prior to visit.     No Known Allergies   Objective:  BP 105/66   Pulse 91   Temp 98 F (36.7 C) (Oral)   Resp 16   Wt 128 lb (58.1 kg)   SpO2 97%   BMI 21.97 kg/m   Physical Exam Vitals signs reviewed.  Constitutional:      Appearance: Normal appearance.  HENT:     Head:   Neurological:     Mental Status: She is alert.     Assessment and Plan :  1. Abscess - WOUND CULTURE - doxycycline (VIBRA-TABS) 100 MG tablet; Take 1 tablet (100 mg total) by mouth  2 (two) times daily.  Dispense: 20 tablet; Refill: 0    Marco Collie, PA-C  [facility] 08/10/2018 3:27 PM  Please note: Portions of this report may have been transcribed using dragon voice recognition software. Every effort was made to ensure accuracy; however, inadvertent computerized transcription errors may be present.

## 2018-08-10 NOTE — Patient Instructions (Addendum)
Start taking Doxycyline twice daily for the next 10 days. We will contact you if you need to change your antibiotic.  Take with food.    Doxycycline oral tablets (Periodontitis) What is this medicine? DOXYCYCLINE (dox i SYE kleen) is a tetracycline antibiotic. It kills certain bacteria or stops their growth. This medicine is used to treat a dental infection called periodontitis. This medicine may be used for other purposes; ask your health care provider or pharmacist if you have questions. COMMON BRAND NAME(S): Oraxyl, Periostat What should I tell my health care provider before I take this medicine? They need to know if you have any of these conditions: -liver disease -long exposure to sunlight like working outdoors -stomach problems like colitis -an unusual or allergic reaction to doxycycline, tetracycline antibiotics, other medicines, foods, dyes, or preservatives -pregnant or trying to get pregnant -breast-feeding How should I use this medicine? Take this medicine by mouth with a full glass of water. Follow the directions on the prescription label. Take this medicine at least 1 hour before or 2 hours after food. Take your medicine at regular intervals. Do not take your medicine more often than directed. Take all of your medicine as directed even if you think you are better. Do not skip doses or stop your medicine early. Talk to your pediatrician regarding the use of this medicine in children. Special care may be needed. Overdosage: If you think you have taken too much of this medicine contact a poison control center or emergency room at once. NOTE: This medicine is only for you. Do not share this medicine with others. What if I miss a dose? If you miss a dose, take it as soon as you can. If it is almost time for your next dose, take only that dose. Do not take double or extra doses. What may interact with this medicine? -antacids -barbiturates -birth control pills -bismuth  subsalicylate -carbamazepine -methoxyflurane -other antibiotics -phenytoin -vitamins that contain iron -warfarin This list may not describe all possible interactions. Give your health care provider a list of all the medicines, herbs, non-prescription drugs, or dietary supplements you use. Also tell them if you smoke, drink alcohol, or use illegal drugs. Some items may interact with your medicine. What should I watch for while using this medicine? Tell your doctor or health care professional if your symptoms do not improve. Do not treat diarrhea with over the counter products. Contact your doctor if you have diarrhea that lasts more than 2 days or if it is severe and watery. Do not take this medicine just before going to bed. It may not dissolve properly when you lay down and can cause pain in your throat. Drink plenty of fluids while taking this medicine to also help reduce irritation in your throat. This medicine can make you more sensitive to the sun. Keep out of the sun. If you cannot avoid being in the sun, wear protective clothing and use sunscreen. Do not use sun lamps or tanning beds/booths. Birth control pills may not work properly while you are taking this medicine. Talk to your doctor about using an extra method of birth control. If you are being treated for a sexually transmitted infection, avoid sexual contact until you have finished your treatment. Your sexual partner may also need treatment. Avoid antacids, aluminum, calcium, magnesium, and iron products for 4 hours before and 2 hours after taking a dose of this medicine. What side effects may I notice from receiving this medicine? Side effects that you  should report to your doctor or health care professional as soon as possible: -allergic reactions like skin rash, itching or hives, swelling of the face, lips, or tongue -difficulty breathing -fever -itching in the rectal or genital area -pain on swallowing -redness, blistering,  peeling or loosening of the skin, including inside the mouth -severe stomach pain or cramps -unusual bleeding or bruising -unusually weak or tired -yellowing of the eyes or skin Side effects that usually do not require medical attention (report to your doctor or health care professional if they continue or are bothersome): -diarrhea -loss of appetite -nausea, vomiting This list may not describe all possible side effects. Call your doctor for medical advice about side effects. You may report side effects to FDA at 1-800-FDA-1088. Where should I keep my medicine? Keep out of the reach of children. Store at room temperature between 15 and 30 degrees C (59 and 86 degrees F). Protect from light. Keep container tightly closed. Throw away any unused medicine after the expiration date. Taking this medicine after the expiration date can make you seriously ill. NOTE: This sheet is a summary. It may not cover all possible information. If you have questions about this medicine, talk to your doctor, pharmacist, or health care provider.  2019 Elsevier/Gold Standard (2007-10-20 14:50:34)

## 2018-08-13 LAB — WOUND CULTURE

## 2018-08-29 ENCOUNTER — Other Ambulatory Visit: Payer: Self-pay

## 2018-08-29 ENCOUNTER — Ambulatory Visit
Admission: EM | Admit: 2018-08-29 | Discharge: 2018-08-29 | Disposition: A | Discharge status: Home / Self Care | Payer: BLUE CROSS/BLUE SHIELD | Attending: Family Medicine | Admitting: Family Medicine

## 2018-08-29 ENCOUNTER — Encounter: Payer: Self-pay | Admitting: Emergency Medicine

## 2018-08-29 DIAGNOSIS — L739 Follicular disorder, unspecified: Secondary | ICD-10-CM | POA: Diagnosis not present

## 2018-08-29 MED ORDER — DOXYCYCLINE HYCLATE 100 MG PO TABS: 100.0000 mg | ORAL_TABLET | Freq: Two times a day (BID) | ORAL | 0 refills | Status: DC

## 2018-08-29 NOTE — ED Provider Notes (Signed)
[facility]    CSN: [id number] Arrival date & time: 08/29/18  1437     History   Chief Complaint Chief Complaint  Patient presents with  . Rash    appt    HPI Sheri Gray is a 48 y.o. female.   48 yo female with a c/o rash on her scalp. States she was seen earlier this month and treated with doxycycline for this which helped, however did not completely resolve. States she was treated for 10 days with doxycycline.   The history is provided by the patient.  Rash    Past Medical History:  Diagnosis Date  . Hypersomnia, persistent   . Vitamin D deficiency     Patient Active Problem List   Diagnosis Date Noted  . 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 Insomnia, idiopathic 07/31/2014  . Snoring 07/31/2014  . Hypersomnia, persistent 07/31/2014  . Postviral fatigue syndrome 07/31/2014  . Other malaise and fatigue 09/29/2013  . Weight gain 09/29/2013    Past Surgical History:  Procedure Laterality Date  . FRACTURE SURGERY      OB History   No obstetric history on file.      Home Medications    Prior to Admission medications   Medication Sig Start Date End Date Taking? Authorizing Provider  amphetamine-dextroamphetamine (ADDERALL XR) 30 MG 24 hr capsule Take 30 mg by mouth daily. 1 tab per day.   Yes [provider]  B Complex Vitamins (VITAMIN B-COMPLEX) TABS Take by mouth.   Yes [provider]  ergocalciferol (VITAMIN D2) 50000 UNITS capsule Take 50,000 Units by mouth daily.   Yes [provider]  magnesium 30 MG tablet Take 30 mg by mouth 2 (two) times daily.   Yes [provider]  Melatonin 1 MG TABS Take by mouth.   Yes [provider]  Multiple Vitamins-Minerals (MULTIVITAMIN PO) Take 2 tablets by mouth daily.    Yes [provider]  doxycycline (VIBRA-TABS) 100 MG tablet Take 1 tablet (100 mg total) by mouth 2 (two) times daily. 08/10/18   McVey, Madelaine Bhat, PA-C    doxycycline (VIBRA-TABS) 100 MG tablet Take 1 tablet (100 mg total) by mouth 2 (two) times daily. 08/29/18   Payton Mccallum, MD  metroNIDAZOLE (FLAGYL) 500 MG tablet Take 1 tablet (500 mg total) by mouth 2 (two) times daily. 04/20/18   Chinita Pester, FNP    Family History Family History  Problem Relation Age of Onset  . Cancer Father   . Obesity Mother   . Diabetes Mother   . Skin cancer Other     Social History Social History   Tobacco Use  . Smoking status: Never Smoker  . Smokeless tobacco: Never Used  Substance Use Topics  . Alcohol use: No  . Drug use: No     Allergies   Patient has no known allergies.   Review of Systems Review of Systems  Skin: Positive for rash.     Physical Exam Triage Vital Signs ED Triage Vitals  Enc Vitals Group     BP 08/29/18 1531 109/87     Pulse Rate 08/29/18 1531 62     Resp 08/29/18 1531 16     Temp 08/29/18 1531 98.1 F (36.7 C)     Temp Source 08/29/18 1531 Oral     SpO2 08/29/18 1531 100 %     Weight 08/29/18 1527 120 lb (54.4 kg)     Height 08/29/18 1527 5\' 3"  (  1.6 m)     Head Circumference --      Peak Flow --      Pain Score 08/29/18 1527 0     Pain Loc --      Pain Edu? --      Excl. in [place]? --    No data found.  Updated Vital Signs BP 109/87 (BP Location: Left Arm)   Pulse 62   Temp 98.1 F (36.7 C) (Oral)   Resp 16   Ht 5\' 3"  (1.6 m)   Wt 54.4 kg   LMP 08/27/2018 (Approximate)   SpO2 100%   BMI 21.26 kg/m   Visual Acuity Right Eye Distance:   Left Eye Distance:   Bilateral Distance:    Right Eye Near:   Left Eye Near:    Bilateral Near:     Physical Exam Vitals signs and nursing note reviewed.  Constitutional:      General: She is not in acute distress.    Appearance: She is not toxic-appearing or diaphoretic.  Skin:    Comments: approx 2-3 cm area on top of scalp with mild erythema, decreased hair; no tenderness; no drainage  Neurological:     Mental Status: She is alert.       UC Treatments / Results  Labs (all labs ordered are listed, but only abnormal results are displayed) Labs Reviewed - No data to display  EKG None  Radiology No results found.  Procedures Procedures (including critical care time)  Medications Ordered in UC Medications - No data to display  Initial Impression / Assessment and Plan / UC Course  I have reviewed the triage vital signs and the nursing notes.  Pertinent labs & imaging results that were available during my care of the patient were reviewed by me and considered in my medical decision making (see chart for details).      Final Clinical Impressions(s) / UC Diagnoses   Final diagnoses:  Folliculitis    ED Prescriptions    Medication Sig Dispense Auth. Provider   doxycycline (VIBRA-TABS) 100 MG tablet Take 1 tablet (100 mg total) by mouth 2 (two) times daily. 20 tablet Payton Mccallum, MD     1. diagnosis reviewed with patient 2. rx as per orders above; reviewed possible side effects, interactions, risks and benefits  3. Follow-up prn if symptoms worsen or don't improve   Controlled Substance Prescriptions [place] Controlled Substance Registry consulted? Not Applicable   Payton Mccallum, MD 08/29/18 [phone]

## 2018-08-29 NOTE — ED Triage Notes (Signed)
Pt has a rash in her scalp. She was given an oral antibiotic (doxy) from urgent care in [place]. The rash is better but not completely resolved.

## 2018-09-09 ENCOUNTER — Encounter: Payer: Self-pay | Admitting: Emergency Medicine

## 2018-09-09 ENCOUNTER — Other Ambulatory Visit: Payer: Self-pay

## 2018-09-09 ENCOUNTER — Ambulatory Visit
Admission: EM | Admit: 2018-09-09 | Discharge: 2018-09-09 | Disposition: A | Discharge status: Home / Self Care | Payer: BLUE CROSS/BLUE SHIELD | Attending: Family Medicine | Admitting: Family Medicine

## 2018-09-09 DIAGNOSIS — L659 Nonscarring hair loss, unspecified: Secondary | ICD-10-CM

## 2018-09-09 DIAGNOSIS — L739 Follicular disorder, unspecified: Secondary | ICD-10-CM | POA: Diagnosis not present

## 2018-09-09 NOTE — ED Triage Notes (Signed)
Patient c/o rash on her scalp that has not completely gone away over a month ago.

## 2018-09-09 NOTE — ED Provider Notes (Signed)
[facility]    CSN: [id number] Arrival date & time: 09/09/18  1607     History   Chief Complaint Chief Complaint  Patient presents with  . Rash    HPI Sheri Gray is a 48 y.o. female.   48 yo female with a h/o folliculitis and alopecia presents with a c/o continuing bald spot on scalp. States area has improved however still not completely back to normal. Denies any pain, fevers, drainage, itching. No other symptoms at this time.   The history is provided by the patient.  Rash    Past Medical History:  Diagnosis Date  . Hypersomnia, persistent   . Vitamin D deficiency     Patient Active Problem List   Diagnosis Date Noted  . 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 Insomnia, idiopathic 07/31/2014  . Snoring 07/31/2014  . Hypersomnia, persistent 07/31/2014  . Postviral fatigue syndrome 07/31/2014  . Other malaise and fatigue 09/29/2013  . Weight gain 09/29/2013    Past Surgical History:  Procedure Laterality Date  . FRACTURE SURGERY      OB History   No obstetric history on file.      Home Medications    Prior to Admission medications   Medication Sig Start Date End Date Taking? Authorizing Provider  amphetamine-dextroamphetamine (ADDERALL XR) 30 MG 24 hr capsule Take 30 mg by mouth daily. 1 tab per day.   Yes [provider]  B Complex Vitamins (VITAMIN B-COMPLEX) TABS Take by mouth.   Yes [provider]  ergocalciferol (VITAMIN D2) 50000 UNITS capsule Take 50,000 Units by mouth daily.   Yes [provider]  magnesium 30 MG tablet Take 30 mg by mouth 2 (two) times daily.   Yes [provider]  Melatonin 1 MG TABS Take by mouth.   Yes [provider]  Multiple Vitamins-Minerals (MULTIVITAMIN PO) Take 2 tablets by mouth daily.    Yes [provider]  doxycycline (VIBRA-TABS) 100 MG tablet Take 1 tablet (100 mg total) by mouth 2 (two) times daily. 08/10/18   McVey, Madelaine Bhat,  PA-C  doxycycline (VIBRA-TABS) 100 MG tablet Take 1 tablet (100 mg total) by mouth 2 (two) times daily. 08/29/18   Payton Mccallum, MD  metroNIDAZOLE (FLAGYL) 500 MG tablet Take 1 tablet (500 mg total) by mouth 2 (two) times daily. 04/20/18   Chinita Pester, FNP    Family History Family History  Problem Relation Age of Onset  . Cancer Father   . Obesity Mother   . Diabetes Mother   . Skin cancer Other     Social History Social History   Tobacco Use  . Smoking status: Never Smoker  . Smokeless tobacco: Never Used  Substance Use Topics  . Alcohol use: No  . Drug use: No     Allergies   Patient has no known allergies.   Review of Systems Review of Systems  Skin: Positive for rash.     Physical Exam Triage Vital Signs ED Triage Vitals  Enc Vitals Group     BP 09/09/18 1622 109/69     Pulse Rate 09/09/18 1622 73     Resp 09/09/18 1622 14     Temp 09/09/18 1622 98.3 F (36.8 C)     Temp Source 09/09/18 1622 Oral     SpO2 09/09/18 1622 100 %     Weight 09/09/18 1619 120 lb (54.4 kg)     Height 09/09/18 1619 5\' 3"  (1.6 m)  Head Circumference --      Peak Flow --      Pain Score 09/09/18 1619 0     Pain Loc --      Pain Edu? --      Excl. in GC? --    No data found.  Updated Vital Signs BP 109/69 (BP Location: Right Arm)   Pulse 73   Temp 98.3 F (36.8 C) (Oral)   Resp 14   Ht 5\' 3"  (1.6 m)   Wt 54.4 kg   LMP 08/27/2018 (Approximate)   SpO2 100%   BMI 21.26 kg/m   Visual Acuity Right Eye Distance:   Left Eye Distance:   Bilateral Distance:    Right Eye Near:   Left Eye Near:    Bilateral Near:     Physical Exam Vitals signs and nursing note reviewed.  Constitutional:      General: She is not in acute distress.    Appearance: She is not ill-appearing or toxic-appearing.  Skin:    Comments: approx 2x2cm area on top of scalp without hair; no scarring noted.  Neurological:     Mental Status: She is alert.      UC Treatments / Results    Labs (all labs ordered are listed, but only abnormal results are displayed) Labs Reviewed - No data to display  EKG None  Radiology No results found.  Procedures Procedures (including critical care time)  Medications Ordered in UC Medications - No data to display  Initial Impression / Assessment and Plan / UC Course  I have reviewed the triage vital signs and the nursing notes.  Pertinent labs & imaging results that were available during my care of the patient were reviewed by me and considered in my medical decision making (see chart for details).     Final Clinical Impressions(s) / UC Diagnoses   Final diagnoses:  Folliculitis  Non-scarring hair loss     Discharge Instructions     Continue to monitor and follow up with dermatologist    ED Prescriptions    None      1. diagnosis reviewed with patient; no need for more antibiotic; recommend follow up with dermatologist  Controlled Substance Prescriptions [place] Controlled Substance Registry consulted? Not Applicable   Payton Mccallum, MD 09/09/18 [phone]

## 2018-09-09 NOTE — Discharge Instructions (Signed)
Continue to monitor and follow up with dermatologist

## 2018-11-02 ENCOUNTER — Ambulatory Visit
Admission: EM | Admit: 2018-11-02 | Discharge: 2018-11-02 | Disposition: A | Discharge status: Home / Self Care | Payer: BLUE CROSS/BLUE SHIELD | Attending: Emergency Medicine | Admitting: Emergency Medicine

## 2018-11-02 ENCOUNTER — Other Ambulatory Visit: Payer: Self-pay

## 2018-11-02 ENCOUNTER — Encounter: Payer: Self-pay | Admitting: Emergency Medicine

## 2018-11-02 DIAGNOSIS — L989 Disorder of the skin and subcutaneous tissue, unspecified: Secondary | ICD-10-CM | POA: Diagnosis not present

## 2018-11-02 DIAGNOSIS — L659 Nonscarring hair loss, unspecified: Secondary | ICD-10-CM | POA: Diagnosis not present

## 2018-11-02 MED ORDER — PREDNISONE 10 MG PO TABS: ORAL_TABLET | ORAL | 0 refills | Status: DC

## 2018-11-02 MED ORDER — SULFAMETHOXAZOLE-TRIMETHOPRIM 800-160 MG PO TABS: 1.0000 | ORAL_TABLET | Freq: Two times a day (BID) | ORAL | 0 refills | Status: AC

## 2018-11-02 NOTE — ED Provider Notes (Signed)
[facility] ____________________________________________  Time seen: Approximately 3:09 PM  I have reviewed the triage vital signs and the nursing notes.   HISTORY  Chief Complaint Recurrent Skin Infections  HPI Sheri Gray is a 48 y.o. female presenting for evaluation of recurrent skin infection to her scalp.  States this is been ongoing for the last approximate 4 months.  States antibiotic courses do help but does not seem to fully resolve.  Is also tried some shampoos without resolution.  Was last seen and treated with antibiotic in February with doxycycline which she reports did help but not fully resolved.  States the area is slightly itchy and slightly tender but denies real pain associated with it.  Does report hair loss and localized area.  Had an appointment to see dermatologist but was canceled due to COVID-19.  Denies any other changes elsewhere.  States when symptoms initially started she started with bumps along her hairline in her scalp with one larger area.  States the other bumps have seemed to improve and only has the 1 larger area.  Denies any changes in foods, medicines, lotions, detergents or other contacts.  Does report her boyfriend has a cat and unsure if related.  Reports otherwise doing well.  No recent sickness, fevers, chest pain or shortness of breath.  Patient's last menstrual period was 10/26/2018 (approximate).denies pregnancy.    Past Medical History:  Diagnosis Date  . Hypersomnia, persistent   . Vitamin D deficiency     Patient Active Problem List   Diagnosis Date Noted  . 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 Insomnia, idiopathic 07/31/2014  . Snoring 07/31/2014  . Hypersomnia, persistent 07/31/2014  . Postviral fatigue syndrome 07/31/2014  . Other malaise and fatigue 09/29/2013  . Weight gain 09/29/2013    Past Surgical History:  Procedure Laterality Date  . FRACTURE SURGERY       No current facility-administered  medications for this encounter.   Current Outpatient Medications:  .  amphetamine-dextroamphetamine (ADDERALL XR) 30 MG 24 hr capsule, Take 30 mg by mouth daily. 1 tab per day., Disp: , Rfl:  .  B Complex Vitamins (VITAMIN B-COMPLEX) TABS, Take by mouth., Disp: , Rfl:  .  ergocalciferol (VITAMIN D2) 50000 UNITS capsule, Take 50,000 Units by mouth daily., Disp: , Rfl:  .  magnesium 30 MG tablet, Take 30 mg by mouth 2 (two) times daily., Disp: , Rfl:  .  Melatonin 1 MG TABS, Take by mouth., Disp: , Rfl:  .  Multiple Vitamins-Minerals (MULTIVITAMIN PO), Take 2 tablets by mouth daily. , Disp: , Rfl:  .  predniSONE (DELTASONE) 10 MG tablet, Start 60 mg po day one, then 50 mg po day two, taper by 10 mg daily until complete., Disp: 21 tablet, Rfl: 0 .  sulfamethoxazole-trimethoprim (BACTRIM DS) 800-160 MG tablet, Take 1 tablet by mouth 2 (two) times daily for 10 days., Disp: 20 tablet, Rfl: 0  Allergies Patient has no known allergies.  Family History  Problem Relation Age of Onset  . Cancer Father   . Obesity Mother   . Diabetes Mother   . Skin cancer Other     Social History Social History   Tobacco Use  . Smoking status: Never Smoker  . Smokeless tobacco: Never Used  Substance Use Topics  . Alcohol use: No  . Drug use: No    Review of Systems Constitutional: No fever Cardiovascular: Denies chest pain. Respiratory: Denies shortness of breath. Gastrointestinal: No abdominal pain.  Musculoskeletal:  Negative for back pain. Skin: as above.   ____________________________________________   PHYSICAL EXAM:  VITAL SIGNS: ED Triage Vitals  Enc Vitals Group     BP 11/02/18 1422 119/85     Pulse Rate 11/02/18 1422 82     Resp 11/02/18 1422 14     Temp 11/02/18 1422 98.2 F (36.8 C)     Temp Source 11/02/18 1422 Oral     SpO2 11/02/18 1422 100 %     Weight 11/02/18 1419 120 lb (54.4 kg)     Height 11/02/18 1419 5\' 3"  (1.6 m)     Head Circumference --      Peak Flow --       Pain Score 11/02/18 1419 2     Pain Loc --      Pain Edu? --      Excl. in GC? --     Constitutional: Alert and oriented. Well appearing and in no acute distress. ENT      Head: Normocephalic and atraumatic. Cardiovascular: Normal rate, regular rhythm. Grossly normal heart sounds.  Good peripheral circulation. Respiratory: Normal respiratory effort without tachypnea nor retractions. Breath sounds are clear and equal bilaterally. No wheezes, rales, rhonchi. Musculoskeletal: Steady gait. Neurologic:  Normal speech and language.  Speech is normal. No gait instability.  Skin:  Skin is warm, dry.  Except: Right top of scalp area of approximately 2 to 3 cm x 2 to 3 cm loss of hair with mild localized erythema, no fluctuance, nontender, no drainage, no other scalp lesions noted. Psychiatric: Mood and affect are normal. Speech and behavior are normal. Patient exhibits appropriate insight and judgment   ___________________________________________   LABS (all labs ordered are listed, but only abnormal results are displayed)  Labs Reviewed - No data to display ____________________________________________   PROCEDURES Procedures   INITIAL IMPRESSION / ASSESSMENT AND PLAN / ED COURSE  Pertinent labs & imaging results that were available during my care of the patient were reviewed by me and considered in my medical decision making (see chart for details).  Well-appearing patient.  No acute distress.  Discussed multiple differentials with patient including folliculitis, inflammatory and tinea.  Patient denies others with similar, and states antibiotics has improved the area in the past, leading to likely infectious and inflammatory.  Will treat with Bactrim and prednisone.  Recommend for patient to closely monitor and follow-up with dermatology.Discussed indication, risks and benefits of medications with patient.  Discussed follow up with Primary care physician this week. Discussed follow up and  return parameters including no resolution or any worsening concerns. Patient verbalized understanding and agreed to plan.   ____________________________________________   FINAL CLINICAL IMPRESSION(S) / ED DIAGNOSES  Final diagnoses:  Lesion of skin of scalp  Patchy loss of hair     ED Discharge Orders         Ordered    sulfamethoxazole-trimethoprim (BACTRIM DS) 800-160 MG tablet  2 times daily     11/02/18 1510    predniSONE (DELTASONE) 10 MG tablet     11/02/18 1510           Note: This dictation was prepared with Dragon dictation along with smaller phrase technology. Any transcriptional errors that result from this process are unintentional.         Renford Dills, NP 11/02/18 1533

## 2018-11-02 NOTE — ED Triage Notes (Signed)
Patient states that she has a skin infection on the back of her head for the past 2-3 months.  Patient states that she has had 2 rounds of antibiotic and it has not gotten better.  Patient reports some tenderness.  Patient denies itchiness.

## 2018-11-02 NOTE — Discharge Instructions (Signed)
Take medication as prescribed. Rest. Drink plenty of fluids.   Follow up with dermatology.   Follow up with your primary care physician this week as needed. Return to Urgent care for new or worsening concerns.

## 2019-08-09 ENCOUNTER — Ambulatory Visit
Admission: EM | Admit: 2019-08-09 | Discharge: 2019-08-09 | Disposition: A | Discharge status: Home / Self Care | Payer: BLUE CROSS/BLUE SHIELD | Attending: Family Medicine | Admitting: Family Medicine

## 2019-08-09 ENCOUNTER — Encounter: Payer: Self-pay | Admitting: Emergency Medicine

## 2019-08-09 ENCOUNTER — Other Ambulatory Visit: Payer: Self-pay

## 2019-08-09 DIAGNOSIS — Z30011 Encounter for initial prescription of contraceptive pills: Secondary | ICD-10-CM | POA: Diagnosis not present

## 2019-08-09 MED ORDER — NORETHIN ACE-ETH ESTRAD-FE 1-20 MG-MCG PO TABS: 1.0000 | ORAL_TABLET | Freq: Every day | ORAL | 11 refills | Status: AC

## 2019-08-09 NOTE — Discharge Instructions (Signed)
Medication as prescribed.  Take care  Dr. Jasman Murri  

## 2019-08-09 NOTE — ED Triage Notes (Signed)
Patient here for birth control pills. She has taken them before but she wants to restart. She is currently looking for a GYN doctor.

## 2019-08-09 NOTE — ED Provider Notes (Signed)
[facility]    CSN: [id number] Arrival date & time: 08/09/19  1321  History   Chief Complaint Chief Complaint  Patient presents with  . Medication Refill   HPI  49 year old female presents requesting to start oral contraceptives.  Patient reports that she has regular menstrual periods.  She has previously been on oral contraceptives.  She states that she would like to restart an oral contraceptive.  She states that she is doing this solely for contraception.  She denies dysmenorrhea.  Has a normal/regular cycle.  No reports of heavy cycles.  She is a non-smoker.  No history of DVT or PE.  No other complaints or concerns at this time.  Past Medical History:  Diagnosis Date  . Hypersomnia, persistent   . Vitamin D deficiency    Patient Active Problem List   Diagnosis Date Noted  . 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 Insomnia, idiopathic 07/31/2014  . Snoring 07/31/2014  . Hypersomnia, persistent 07/31/2014  . Postviral fatigue syndrome 07/31/2014  . Other malaise and fatigue 09/29/2013  . Weight gain 09/29/2013   Past Surgical History:  Procedure Laterality Date  . FRACTURE SURGERY     OB History   No obstetric history on file.    Home Medications    Prior to Admission medications   Medication Sig Start Date End Date Taking? Authorizing Provider  amphetamine-dextroamphetamine (ADDERALL XR) 30 MG 24 hr capsule Take 30 mg by mouth daily. 1 tab per day.   Yes [provider]  B Complex Vitamins (VITAMIN B-COMPLEX) TABS Take by mouth.   Yes [provider]  ergocalciferol (VITAMIN D2) 50000 UNITS capsule Take 50,000 Units by mouth daily.   Yes [provider]  magnesium 30 MG tablet Take 30 mg by mouth 2 (two) times daily.   Yes [provider]  Melatonin 1 MG TABS Take by mouth.   Yes [provider]  Multiple Vitamins-Minerals (MULTIVITAMIN PO) Take 2 tablets by mouth daily.    Yes [provider]  norethindrone-ethinyl estradiol (JUNEL FE 1/20) 1-20 MG-MCG tablet Take 1 tablet by mouth daily. 08/09/19   Coral Spikes, DO   Family History Family History  Problem Relation Age of Onset  . Cancer Father   . Obesity Mother   . Diabetes Mother   . Skin cancer Other    Social History Social History   Tobacco Use  . Smoking status: Never Smoker  . Smokeless tobacco: Never Used  Substance Use Topics  . Alcohol use: No  . Drug use: No   Allergies   Patient has no known allergies.   Review of Systems Review of Systems  Constitutional: Negative.   Respiratory: Negative.    Physical Exam Triage Vital Signs ED Triage Vitals  Enc Vitals Group     BP 08/09/19 1334 112/75     Pulse Rate 08/09/19 1334 78     Resp 08/09/19 1334 18     Temp 08/09/19 1334 98.3 F (36.8 C)     Temp Source 08/09/19 1334 Oral     SpO2 08/09/19 1334 100 %     Weight 08/09/19 1332 128 lb (58.1 kg)     Height 08/09/19 1332 5\' 3"  (1.6 m)     Head Circumference --      Peak Flow --      Pain Score 08/09/19 1332 0     Pain Loc --      Pain Edu? --  Excl. in GC? --    Updated Vital Signs BP 112/75 (BP Location: Right Arm)   Pulse 78   Temp 98.3 F (36.8 C) (Oral)   Resp 18   Ht 5\' 3"  (1.6 m)   Wt 58.1 kg   LMP 07/19/2019   SpO2 100%   BMI 22.67 kg/m   Visual Acuity Right Eye Distance:   Left Eye Distance:   Bilateral Distance:    Right Eye Near:   Left Eye Near:    Bilateral Near:     Physical Exam Vitals and nursing note reviewed.  Constitutional:      General: She is not in acute distress.    Appearance: Normal appearance. She is not ill-appearing.  HENT:     Head: Normocephalic and atraumatic.  Eyes:     General:        Right eye: No discharge.        Left eye: No discharge.     Conjunctiva/sclera: Conjunctivae normal.  Cardiovascular:     Rate and Rhythm: Normal rate and regular rhythm.     Heart sounds: No murmur.  Pulmonary:     Effort: Pulmonary effort is  normal.     Breath sounds: No wheezing, rhonchi or rales.  Neurological:     Mental Status: She is alert.  Psychiatric:        Mood and Affect: Mood normal.        Behavior: Behavior normal.    UC Treatments / Results  Labs (all labs ordered are listed, but only abnormal results are displayed) Labs Reviewed - No data to display  EKG   Radiology No results found.  Procedures Procedures (including critical care time)  Medications Ordered in UC Medications - No data to display  Initial Impression / Assessment and Plan / UC Course  I have reviewed the triage vital signs and the nursing notes.  Pertinent labs & imaging results that were available during my care of the patient were reviewed by me and considered in my medical decision making (see chart for details).    49 year old female presents for initiation of OCP.  No risk factors.  Starting Junel Fe.   Final Clinical Impressions(s) / UC Diagnoses   Final diagnoses:  Initiation of OCP (BCP)     Discharge Instructions     Medication as prescribed.  Take care  Dr. 52    ED Prescriptions    Medication Sig Dispense Auth. Provider   norethindrone-ethinyl estradiol (JUNEL FE 1/20) 1-20 MG-MCG tablet Take 1 tablet by mouth daily. 1 Package [place], [place], [place]     PDMP not reviewed this encounter.   [place], Tommie Sams 08/09/19 1458

## 2019-08-18 ENCOUNTER — Ambulatory Visit
Admission: EM | Admit: 2019-08-18 | Discharge: 2019-08-18 | Disposition: A | Discharge status: Home / Self Care | Payer: BLUE CROSS/BLUE SHIELD | Attending: Emergency Medicine | Admitting: Emergency Medicine

## 2019-08-18 ENCOUNTER — Encounter: Payer: Self-pay | Admitting: Emergency Medicine

## 2019-08-18 ENCOUNTER — Other Ambulatory Visit: Payer: Self-pay

## 2019-08-18 DIAGNOSIS — Z113 Encounter for screening for infections with a predominantly sexual mode of transmission: Secondary | ICD-10-CM | POA: Diagnosis present

## 2019-08-18 DIAGNOSIS — R109 Unspecified abdominal pain: Secondary | ICD-10-CM | POA: Diagnosis present

## 2019-08-18 DIAGNOSIS — N760 Acute vaginitis: Secondary | ICD-10-CM | POA: Insufficient documentation

## 2019-08-18 DIAGNOSIS — B9689 Other specified bacterial agents as the cause of diseases classified elsewhere: Secondary | ICD-10-CM | POA: Diagnosis not present

## 2019-08-18 DIAGNOSIS — N898 Other specified noninflammatory disorders of vagina: Secondary | ICD-10-CM | POA: Diagnosis present

## 2019-08-18 LAB — URINALYSIS, COMPLETE (UACMP) WITH MICROSCOPIC
Bilirubin Urine: NEGATIVE
Glucose, UA: NEGATIVE mg/dL
Ketones, ur: NEGATIVE mg/dL
Leukocytes,Ua: NEGATIVE
Nitrite: NEGATIVE
Protein, ur: NEGATIVE mg/dL
Specific Gravity, Urine: >1.03 — ABNORMAL HIGH (ref 1.005–1.030)
pH: 5.5 (ref 5.0–8.0)

## 2019-08-18 LAB — WET PREP, GENITAL
Sperm: NONE SEEN
Trich, Wet Prep: NONE SEEN
Yeast Wet Prep HPF POC: NONE SEEN

## 2019-08-18 LAB — CHLAMYDIA/NGC RT PCR (ARMC ONLY)
Chlamydia Tr: NOT DETECTED
N gonorrhoeae: NOT DETECTED

## 2019-08-18 LAB — PREGNANCY, URINE: Preg Test, Ur: NEGATIVE

## 2019-08-18 MED ORDER — TINIDAZOLE 500 MG PO TABS: 2.0000 g | ORAL_TABLET | Freq: Every day | ORAL | 1 refills | Status: AC

## 2019-08-18 MED ORDER — TINIDAZOLE 500 MG PO TABS: 2.0000 g | ORAL_TABLET | Freq: Every day | ORAL | 0 refills | Status: DC

## 2019-08-18 NOTE — ED Triage Notes (Signed)
Patient c/o vaginal discharge and burning sensation, and abdominal pain for 2 weeks.  Patient denies fevers.  Patient denies N/V/D.

## 2019-08-18 NOTE — Discharge Instructions (Addendum)
It was very nice seeing you today in clinic. Thank you for entrusting me with your care.   Increase fluid intake. Finish all of the antibiotics that have been prescribed. You will be called if the STD testing comes back positive.   Make arrangements to follow up with your regular doctor in 1 week for re-evaluation if not improving. If your symptoms/condition worsens, please seek follow up care either here or in the ER. Please remember, our [facility] providers are "right here with you" when you need [place].   Again, it was my pleasure to take care of you today. Thank you for choosing our clinic. I hope that you start to feel better quickly.   Quentin Mulling, MSN, APRN, FNP-C, CEN Advanced Practice Provider [facility]

## 2019-08-19 NOTE — ED Provider Notes (Signed)
[place], [place]   Name: Sheri Gray DOB: 12/26/1970 MRN: [id number] CSN: [id number] PCP: Patient, No Pcp Per  Arrival date and time:  08/18/19 1122  Chief Complaint:  Vaginal Discharge and Abdominal Pain   NOTE: Prior to seeing the patient today, I have reviewed the triage nursing documentation and vital signs. Clinical staff has updated patient's PMH/PSHx, current medication list, and drug allergies/intolerances to ensure comprehensive history available to assist in medical decision making.   History:   HPI: Sheri Gray is a 49 y.o. female who presents today with complaints of urinary symptoms that began with acute onset 2 weeks ago. She complains of dysuria (burning); denies frequency and urgency. She has not appreciated any gross hematuria, nor has she noticed her urine being malodorous. Patient denies any associated nausea, vomiting, fever, or chills. She has not experienced any pain in her lower back or flank area. Patient reports abdominal discomfort that she describes as a "gurgling stomach". Patient advises that she does not have a past medical history that is significant for recurrent urinary tract infections. Patient endorses that she engages in unprotected sexual activity. She denies any vaginal pain or bleeding. She notes white vaginal discharge. Patient concerned about STIs and is requesting screening today. Patient's last menstrual period was 07/12/2019 (approximate). There are no concerns that she is currently pregnant.   Past Medical History:  Diagnosis Date  . Hypersomnia, persistent   . Vitamin D deficiency     Past Surgical History:  Procedure Laterality Date  . FRACTURE SURGERY      Family History  Problem Relation Age of Onset  . Cancer Father   . Obesity Mother   . Diabetes Mother   . Skin cancer Other     Social History   Tobacco Use  . Smoking status: Never Smoker  . Smokeless tobacco: Never Used  Substance Use Topics  . Alcohol use: No  .  Drug use: No    Patient Active Problem List   Diagnosis Date Noted  . 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 Insomnia, idiopathic 07/31/2014  . Snoring 07/31/2014  . Hypersomnia, persistent 07/31/2014  . Postviral fatigue syndrome 07/31/2014  . Other malaise and fatigue 09/29/2013  . Weight gain 09/29/2013    Home Medications:    Current Meds  Medication Sig  . amphetamine-dextroamphetamine (ADDERALL XR) 30 MG 24 hr capsule Take 30 mg by mouth daily. 1 tab per day.  . B Complex Vitamins (VITAMIN B-COMPLEX) TABS Take by mouth.  . ergocalciferol (VITAMIN D2) 50000 UNITS capsule Take 50,000 Units by mouth daily.  . Multiple Vitamins-Minerals (MULTIVITAMIN PO) Take 2 tablets by mouth daily.   . norethindrone-ethinyl estradiol (JUNEL FE 1/20) 1-20 MG-MCG tablet Take 1 tablet by mouth daily.    Allergies:   Patient has no known allergies.  Review of Systems (ROS): Review of Systems  Constitutional: Positive for fatigue. Negative for chills and fever.  Respiratory: Negative for cough and shortness of breath.   Cardiovascular: Negative for chest pain and palpitations.  Gastrointestinal: Positive for abdominal pain ("gurgling stomach"). Negative for nausea and vomiting.  Genitourinary: Positive for dysuria and vaginal discharge. Negative for decreased urine volume, frequency, hematuria, pelvic pain, urgency, vaginal bleeding and vaginal pain.  Musculoskeletal: Negative for back pain.  Skin: Negative for color change, pallor and rash.  Neurological: Negative for dizziness, syncope, weakness and headaches.  All other systems reviewed and are negative.    Vital Signs: Today's Vitals   08/18/19 1152 08/18/19  1153 08/18/19 1156 08/18/19 1327  BP:   114/81   Pulse:   83   Resp:   14   Temp:   98.2 F (36.8 C)   TempSrc:   Oral   SpO2:   100%   Weight:  125 lb (56.7 kg)    Height:  5\' 3"  (1.6 m)    PainSc: 3    3     Physical Exam: Physical Exam  Constitutional: She  is oriented to person, place, and time and well-developed, well-nourished, and in no distress.  HENT:  Head: Normocephalic and atraumatic.  Eyes: Pupils are equal, round, and reactive to light.  Cardiovascular: Normal rate, regular rhythm, normal heart sounds and intact distal pulses.  Pulmonary/Chest: Effort normal and breath sounds normal.  Abdominal: Bowel sounds are normal. She exhibits no distension. There is no abdominal tenderness. There is no CVA tenderness.  Genitourinary:    Genitourinary Comments: Exam deferred. No vaginal/pelvic pain or bleeding. Patient is not currently pregnant. She has elected to self collect specimen swab for wet prep and DNA probe for GC.   Neurological: She is alert and oriented to person, place, and time. Gait normal.  Skin: Skin is warm and dry. No rash noted. She is not diaphoretic.  Psychiatric: Mood, memory, affect and judgment normal.  Nursing note and vitals reviewed.   Urgent Care Treatments / Results:   Orders Placed This Encounter  Procedures  . Wet prep, genital  . Chlamydia/NGC rt PCR ([facility] only)  . Pregnancy, urine  . Urinalysis, Complete w Microscopic    LABS: PLEASE NOTE: all labs that were ordered this encounter are listed, however only abnormal results are displayed. Labs Reviewed  WET PREP, GENITAL - Abnormal; Notable for the following components:      Result Value   Clue Cells Wet Prep HPF POC PRESENT (*)    WBC, Wet Prep HPF POC FEW (*)    All other components within normal limits  URINALYSIS, COMPLETE (UACMP) WITH MICROSCOPIC - Abnormal; Notable for the following components:   Specific Gravity, Urine >1.030 (*)    Hgb urine dipstick SMALL (*)    Bacteria, UA RARE (*)    All other components within normal limits  CHLAMYDIA/NGC RT PCR ([facility] ONLY)  PREGNANCY, URINE    EKG: -None  RADIOLOGY: No results found.  PROCEDURES: Procedures  MEDICATIONS RECEIVED THIS VISIT: Medications - No data to  display  PERTINENT CLINICAL COURSE NOTES/UPDATES:   Initial Impression / Assessment and Plan / Urgent Care Course:  Pertinent labs & imaging results that were available during my care of the patient were personally reviewed by me and considered in my medical decision making (see lab/imaging section of note for values and interpretations).  Sheri Gray is a 49 y.o. female who presents to [facility] today with complaints of Vaginal Discharge and Abdominal Pain  Patient is well appearing overall in clinic today. She does not appear to be in any acute distress. Presenting symptoms (see HPI) and exam as documented above. UA today negative for infection; 0-5 WBC/hpf, 0-5 RBC/hpf, rare bacteria, and no nitrites or LE. Urine HCG (-) for pregnancy. DNA probe for GC collected and sent to the lab for testing. Patient aware that she will be contacted with further treatment directives should her testing result as positive.   Wet prep swab did not reveal any candida or trichomonas, however the sample was (+) for clue cells, which is consistent with bacterial vaginosis (BV) infection. Will  treat with a 2 day course of oral tinidazole. Patient encouraged to complete the entire course of antibiotics even if she begins to feel better. Educated on need to avoid all ETOH while she is taking this medication in order to prevent a disulfiram like reaction that will result in significant nausea and vomiting. Patient encouraged to increase her fluid intake as much as possible. Discussed that water is always best to flush the urinary tract and prevent development of a urinary tract infection while on treatment for the BV.  Discussed follow up with primary care physician in 1 week for re-evaluation. I have reviewed the follow up and strict return precautions for any new or worsening symptoms. Patient is aware of symptoms that would be deemed urgent/emergent, and would thus require further evaluation either here or in  the emergency department. At the time of discharge, she verbalized understanding and consent with the discharge plan as it was reviewed with her. All questions were fielded by provider and/or clinic staff prior to patient discharge.    Final Clinical Impressions / Urgent Care Diagnoses:   Final diagnoses:  BV (bacterial vaginosis)  Screen for STD (sexually transmitted disease)  Vaginal discharge  Abdominal discomfort    New Prescriptions:  [place] Controlled Substance Registry consulted? Not Applicable  Meds ordered this encounter  Medications  . tinidazole (TINDAMAX) 500 MG tablet    Sig: Take 4 tablets (2,000 mg total) by mouth daily with breakfast.    Dispense:  8 tablet    Refill:  1    Recommended Follow up Care:  Patient encouraged to follow up with the following provider within the specified time frame, or sooner as dictated by the severity of her symptoms. As always, she was instructed that for any urgent/emergent care needs, she should seek care either here or in the emergency department for more immediate evaluation.  Follow-up Information    PCP In 1 week.   Why: General reassessment of symptoms if not improving        NOTE: This note was prepared using Lobbyist along with smaller Company secretary. Despite my best ability to proofread, there is the potential that transcriptional errors may still occur from this process, and are completely unintentional.    Karen Kitchens, NP 08/19/19 [phone]

## 2022-05-06 ENCOUNTER — Telehealth: Payer: Self-pay | Admitting: Neurology

## 2022-05-06 NOTE — Telephone Encounter (Signed)
Call the patient back and had a 20 min conversation. She states that she is not eating anything that is negative or derogatory to be taken out of her chart.  I advised in reviewing the chart, Dr. Brett Fairy has not written anything that appears to be negative impacting on the patient.  I advised that the diagnosis is that she is asking to be removed or what we were referred to [place] by Dr. Ouida Sills in 2015.  She said that there was 1 listed as insomnia related to her mental condition.  I advised that there was no indication that was listed by Dr Brett Fairy. It appears that was first placed on her chart in 2018 and this was again by the primary care. I advised there is a form that she can complete to ask for them to formally remove from her chart.  The patient spent 20 minutes advising me of the area that happened while she was in the sleep lab here 2016.  I advised her that this was not documented in the chart and there was nothing that indicated there was a concern from the sleep study. The point of her concern was that she was wanting things removed from her chart and the things she was wanting removed were diagnosis that she was referred to [place] with or we completed through out work up. Advised that these were active problems that were going on at that time and are listed as her history which likely will not be removed because it is part of her history. She will reach out to primary care of [place], she has asked that I mail a copy of where I see those diagnosis being entered by Primary care.

## 2022-05-06 NOTE — Telephone Encounter (Signed)
Late Entry:Pt presented 05/05/2022 to the front desk and left papers with the front staff asking for a call from either Dr Dohmeier or the nurse to discuss taking certain diagnosis off the chart. Was informed today 11/1 that a VM was left with Hilda Blades in medical records stating if someone didn't call back she would call the state on [place].   Talked with the manager who agreed that we can call the patient and explain the process. Will call pt.

## 2024-01-04 ENCOUNTER — Encounter: Payer: Self-pay | Admitting: Primary Care

## 2024-01-04 ENCOUNTER — Ambulatory Visit (INDEPENDENT_AMBULATORY_CARE_PROVIDER_SITE_OTHER): Admitting: Primary Care

## 2024-01-04 VITALS — BP 126/70 | HR 95 | Temp 98.1°F | Ht 63.0 in | Wt 155.0 lb

## 2024-01-04 DIAGNOSIS — R0683 Snoring: Secondary | ICD-10-CM | POA: Diagnosis not present

## 2024-01-04 DIAGNOSIS — R0681 Apnea, not elsewhere classified: Secondary | ICD-10-CM | POA: Diagnosis not present

## 2024-01-04 DIAGNOSIS — E7800 Pure hypercholesterolemia, unspecified: Secondary | ICD-10-CM | POA: Diagnosis not present

## 2024-01-04 NOTE — Progress Notes (Signed)
 @Patient  ID: Jon JONETTA Crazier, female    DOB: Jul 10, 1970, 53 y.o.   MRN: [id number]  Chief Complaint  Patient presents with   Follow-up    Snoring consult     Referring provider: Henry Ingle, MD  HPI: 53 year old female, never smoked. PMH significant for insomnia, snoring, weight gain.   01/04/2024 Discussed the use of AI scribe software for clinical note transcription with the patient, who gave verbal consent to proceed.  History of Present Illness   MYANA SCHLUP is a 53 year old female with obstructive sleep apnea who presents for a sleep consult.  She has a history of obstructive sleep apnea (OSA) characterized by excessive snoring and episodes of apnea during sleep. A sleep study conducted in 2016 was negative for OSA. She has experienced 10-15lb weight gain since the previous study. Her partner has observed severe OSA during her sleep. She is interested in exploring treatment options that do not involve CPAP, such as weight loss and possibly medication, pending the results of a new sleep study.  She has a BMI of 27 and a history of elevated cholesterol, with a lipid panel from 2020 showing an LDL of 170 and total cholesterol of 265. She is not on cholesterol-lowering medications and manages her cholesterol through diet and exercise. No diabetes, but she mentions the possibility of being prediabetic.  Her family history is significant for sleep apnea, affecting her mother, grandfather, and uncle, suggesting a familial pattern. No personal or family history of thyroid cancer.     Typical bedtime is between 10:30-11pm. She takes Ambien  5mg  at bedtime for insomnia. It can sometimes take her awhile to fall asleep. Unsure how often she wakes up during the night. She starts her day at 5:15am Monday-Friday. Epworth score 6/24. No concern for narcolepsy or cataplexy.     No Known Allergies   There is no immunization history on file for this patient.  Past Medical History:   Diagnosis Date   Hypersomnia, persistent    Vitamin D  deficiency     Tobacco History: Social History   Tobacco Use  Smoking Status Never  Smokeless Tobacco Never   Counseling given: Not Answered   Outpatient Medications Prior to Visit  Medication Sig Dispense Refill   amphetamine-dextroamphetamine (ADDERALL XR) 30 MG 24 hr capsule Take 30 mg by mouth daily. 1 tab per day.     B Complex Vitamins (VITAMIN B-COMPLEX) TABS Take by mouth.     ergocalciferol  (VITAMIN D2) 50000 UNITS capsule Take 50,000 Units by mouth daily.     Multiple Vitamins-Minerals (MULTIVITAMIN PO) Take 2 tablets by mouth daily.      norethindrone-ethinyl estradiol (JUNEL FE 1/20) 1-20 MG-MCG tablet Take 1 tablet by mouth daily. 1 Package 11   tinidazole  (TINDAMAX ) 500 MG tablet Take 4 tablets (2,000 mg total) by mouth daily with breakfast. 8 tablet 1   zolpidem  (AMBIEN ) 10 MG tablet Take 10 mg by mouth at bedtime as needed for sleep.     No facility-administered medications prior to visit.      Review of Systems  Review of Systems  Constitutional:  Positive for fatigue.  HENT: Negative.    Respiratory:  Positive for apnea. Negative for cough and shortness of breath.   Psychiatric/Behavioral:  Positive for sleep disturbance.      Physical Exam  BP 126/70 (BP Location: Left Arm, Patient Position: Sitting, Cuff Size: Normal)   Pulse 95   Temp 98.1 F (36.7 C) (Temporal)  Ht 5' 3 (1.6 m)   Wt 155 lb (70.3 kg)   SpO2 99%   BMI 27.46 kg/m  Physical Exam Constitutional:      Appearance: Normal appearance.  HENT:     Head: Normocephalic and atraumatic.     Mouth/Throat:     Mouth: Mucous membranes are moist.     Pharynx: Oropharynx is clear.     Comments: Mallampati class II  Cardiovascular:     Rate and Rhythm: Normal rate and regular rhythm.  Pulmonary:     Effort: Pulmonary effort is normal.     Breath sounds: Normal breath sounds.   Musculoskeletal:        General: Normal range of  motion.   Skin:    General: Skin is warm and dry.   Neurological:     General: No focal deficit present.     Mental Status: She is alert and oriented to person, place, and time. Mental status is at baseline.   Psychiatric:        Mood and Affect: Mood normal.        Behavior: Behavior normal.        Thought Content: Thought content normal.        Judgment: Judgment normal.      Lab Results:  CBC    Component Value Date/Time   WBC 5.9 04/17/2017 1602   WBC 9.0 03/05/2013 1739   RBC 4.34 04/17/2017 1602   RBC 4.23 03/05/2013 1739   HGB 13.0 04/17/2017 1602   HCT 38.8 04/17/2017 1602   PLT 288 04/17/2017 1602   MCV 89 04/17/2017 1602   MCH 30.0 04/17/2017 1602   MCH 30.3 03/05/2013 1739   MCHC 33.5 04/17/2017 1602   MCHC 32.2 03/05/2013 1739   RDW 12.9 04/17/2017 1602   LYMPHSABS 1.9 04/17/2017 1602   EOSABS 0.3 04/17/2017 1602   BASOSABS 0.0 04/17/2017 1602    BMET    Component Value Date/Time   NA 141 04/17/2017 1602   K 4.3 04/17/2017 1602   CL 103 04/17/2017 1602   CO2 24 04/17/2017 1602   GLUCOSE 96 04/17/2017 1602   GLUCOSE 92 03/05/2013 1727   BUN 9 04/17/2017 1602   CREATININE 0.63 04/17/2017 1602   CREATININE 0.72 03/05/2013 1727   CALCIUM 9.2 04/17/2017 1602   GFRNONAA 108 04/17/2017 1602   GFRAA 124 04/17/2017 1602    BNP No results found for: BNP  ProBNP No results found for: PROBNP  Imaging: No results found.   Assessment & Plan:   No problem-specific Assessment & Plan notes found for this encounter.  1. Loud snoring (Primary) - Home sleep test; Future  2. Witnessed episode of apnea - Home sleep test; Future  3. Elevated LDL cholesterol level  Assessment and Plan    Witnessed apnea  Patient reports symptoms of loud snoring and witnessed apnea episodes. PSG was negative for sleep disordered breathing in 2016. She has gained weight since last sleep study. Interested in weight loss as a treatment option. Risks of untreated  moderate to severe OSA include cardiac arrhythmias, pulmonary hypertension, diabetes, and potential links to Alzheimer's disease. Discussed Zepbound, approved for moderate to severe OSA with BMI =27 and at least one weight-related comorbidity. Alternative treatments include CPAP and oral appliances. Insurance coverage for Zepbound is variable, with a self-pay option available. Potential side effects include nausea, vomiting, delayed gastric emptying, constipation, and diarrhea. - Order home sleep study through [facility]. - Discuss Zepbound treatment if sleep study  confirms OSA. - Advise checking insurance coverage for Zepbound. - Consider self-pay option for Zepbound if not covered by insurance. - Discuss potential side effects of Zepbound, including nausea, vomiting, delayed gastric emptying, constipation, and diarrhea. - Recommend side sleeping position or use of a wedge pillow to reduce airway obstruction. - Schedule virtual visit to discuss treatment options and step-up plan if Zepbound is prescribed.  Elevated Cholesterol Moderately elevated cholesterol with a 2020 lipid panel showing LDL of 170 and total cholesterol of 265. Managed with diet and exercise. - Consider Zepbound treatment if sleep study confirms moderate to severe OSA and insurance covers it.   Almarie LELON Ferrari, NP 01/04/2024

## 2024-01-04 NOTE — Patient Instructions (Addendum)
    -  OBSTRUCTIVE SLEEP APNEA (OSA): OSA is a condition where your airway becomes blocked during sleep, causing breathing pauses and loud snoring. We will order a home sleep study through [facility] to reassess your condition. If the study confirms moderate to severe OSA, we will discuss the possibility of starting Zepbound, a medication approved for OSA in individuals with a BMI of 27 or higher and at least one weight-related health issue. Please check with your insurance about coverage for Zepbound, and consider the self-pay option if it is not covered. Be aware of potential side effects like nausea, vomiting, delayed gastric emptying, constipation, and diarrhea. In the meantime, try sleeping on your side or using medcline / wedge pillow to help reduce airway obstruction. We may also refer you to a dentist for an oral appliance if OSA is confirmed. We will schedule a virtual visit to discuss your treatment options and next steps if Zepbound is prescribed.  -ELEVATED CHOLESTEROL: Elevated cholesterol means you have higher levels of cholesterol in your blood, which can increase your risk of heart disease. Your last lipid panel in 2020 showed an LDL of 170 and total cholesterol of 265. You are currently managing this with diet and exercise. If your sleep study confirms moderate to severe OSA, you may qualify for Zepbound treatment, which could also help manage your cholesterol levels.  INSTRUCTIONS: Please complete the home sleep study through [facility] as soon as possible. Check with your insurance about coverage for Zepbound, and consider the self-pay option if it is not covered. We will schedule a virtual visit to discuss your treatment options and next steps once we have the results of your sleep study.  Orders: Home sleep study   Follow-up 8-12 weeks with Landry NP

## 2024-02-08 ENCOUNTER — Ambulatory Visit

## 2024-02-08 DIAGNOSIS — R0681 Apnea, not elsewhere classified: Secondary | ICD-10-CM

## 2024-02-08 DIAGNOSIS — R0683 Snoring: Secondary | ICD-10-CM

## 2024-02-24 DIAGNOSIS — G4733 Obstructive sleep apnea (adult) (pediatric): Secondary | ICD-10-CM

## 2024-02-28 ENCOUNTER — Ambulatory Visit: Payer: Self-pay | Admitting: Primary Care

## 2024-02-28 NOTE — Progress Notes (Signed)
 HST showed moderate OSA, see if she can do virtual visit with me today at 4pm instead of 9/24

## 2024-03-02 NOTE — Progress Notes (Signed)
 I called and spoke to pt. Pt informed pf Beth's note and verbalized understanding. Pt requested a sooner appt, so pt was rescheduled for 03-14-24 with verbal approval from Almarie Ferrari, NP. NFN

## 2024-03-14 ENCOUNTER — Ambulatory Visit (INDEPENDENT_AMBULATORY_CARE_PROVIDER_SITE_OTHER): Admitting: Primary Care

## 2024-03-14 ENCOUNTER — Encounter: Payer: Self-pay | Admitting: Primary Care

## 2024-03-14 VITALS — BP 126/62 | HR 89 | Temp 97.9°F | Ht 63.0 in | Wt 155.0 lb

## 2024-03-14 DIAGNOSIS — G4733 Obstructive sleep apnea (adult) (pediatric): Secondary | ICD-10-CM | POA: Diagnosis not present

## 2024-03-14 DIAGNOSIS — R0683 Snoring: Secondary | ICD-10-CM | POA: Diagnosis not present

## 2024-03-14 NOTE — Progress Notes (Signed)
 @Patient  ID: Sheri Gray, female    DOB: 07/14/1970, 53 y.o.   MRN: [id number]  Chief Complaint  Patient presents with   Obstructive Sleep Apnea    HST results     Referring provider: Henry Ingle, MD  HPI: 53 year old female, never smoked. PMH significant for insomnia, snoring, weight gain.   Previous [facility] pulmonary encounter: 01/04/2024 Discussed the use of AI scribe software for clinical note transcription with the patient, who gave verbal consent to proceed.  History of Present Illness   Sheri Gray is a 53 year old female with obstructive sleep apnea who presents for a sleep consult.  She has history of symptoms consistent with obstructive sleep apnea (OSA) characterized by excessive snoring and episodes of apnea during sleep. A sleep study conducted in 2016 was negative for OSA. She has experienced 10-15lb weight gain since the previous study. Her partner has observed severe OSA during her sleep. She is interested in exploring treatment options that do not involve CPAP, such as weight loss and possibly medication, pending the results of a new sleep study.  She has a BMI of 27 and a history of elevated cholesterol, with a lipid panel from 2020 showing an LDL of 170 and total cholesterol of 265. She is not on cholesterol-lowering medications and manages her cholesterol through diet and exercise. No diabetes, but she mentions the possibility of being prediabetic.  Her family history is significant for sleep apnea, affecting her mother, grandfather, and uncle, suggesting a familial pattern. No personal or family history of thyroid cancer.     Typical bedtime is between 10:30-11pm. She takes Ambien  5mg  at bedtime for insomnia. It can sometimes take her awhile to fall asleep. Unsure how often she wakes up during the night. She starts her day at 5:15am Monday-Friday. Epworth score 6/24. No concern for narcolepsy or cataplexy.     03/14/2024- Interim hx  Discussed the use of AI  scribe software for clinical note transcription with the patient, who gave verbal consent to proceed.  History of Present Illness Sheri Gray is a 53 year old female who presents for follow-up after a sleep study for sleep apnea.  She has a history of excessive snoring and episodes of apnea during sleep. A sleep study conducted in 2016 was negative for obstructive sleep apnea. Since then, she has experienced a 10 to 15 pound weight gain, and her partner has observed apneas during her sleep.  A repeat sleep study showed moderate obstructive sleep apnea with an average of 17 apneic events per hour. Her oxygen levels dropped to a low of 75%, with an average of 94%. She spent only four minutes with an oxygen level less than 90%.  She has tried phentermine in the past for weight loss but found it unsuccessful. She does not have a history of diabetes or thyroid cancer, although her mother has thyroid issues and diabetes. Her cholesterol is slightly elevated, but she is not on any diabetic medication.   No Known Allergies   There is no immunization history on file for this patient.  Past Medical History:  Diagnosis Date   Hypersomnia, persistent    Vitamin D  deficiency     Tobacco History: Social History   Tobacco Use  Smoking Status Never  Smokeless Tobacco Never   Counseling given: Not Answered   Outpatient Medications Prior to Visit  Medication Sig Dispense Refill   amphetamine-dextroamphetamine (ADDERALL XR) 30 MG 24 hr capsule Take 30 mg by  mouth daily. 1 tab per day.     B Complex Vitamins (VITAMIN B-COMPLEX) TABS Take by mouth.     Multiple Vitamins-Minerals (MULTIVITAMIN PO) Take 2 tablets by mouth daily.      zolpidem  (AMBIEN ) 10 MG tablet Take 10 mg by mouth at bedtime as needed for sleep.     ergocalciferol  (VITAMIN D2) 50000 UNITS capsule Take 50,000 Units by mouth daily.     norethindrone-ethinyl estradiol (JUNEL FE 1/20) 1-20 MG-MCG tablet Take 1 tablet by mouth  daily. 1 Package 11   tinidazole  (TINDAMAX ) 500 MG tablet Take 4 tablets (2,000 mg total) by mouth daily with breakfast. 8 tablet 1   No facility-administered medications prior to visit.   Review of Systems  Review of Systems  Constitutional:  Positive for unexpected weight change.  Respiratory: Negative.    Psychiatric/Behavioral:  Positive for sleep disturbance.    Physical Exam  BP 126/62   Pulse 89   Temp 97.9 F (36.6 C)   Ht 5' 3 (1.6 m)   Wt 155 lb (70.3 kg)   SpO2 96%   BMI 27.46 kg/m  Physical Exam Constitutional:      General: She is not in acute distress.    Appearance: Normal appearance. She is not ill-appearing.  HENT:     Head: Normocephalic and atraumatic.     Mouth/Throat:     Mouth: Mucous membranes are moist.     Pharynx: Oropharynx is clear.  Cardiovascular:     Rate and Rhythm: Normal rate and regular rhythm.  Pulmonary:     Effort: Pulmonary effort is normal.     Breath sounds: Normal breath sounds. No wheezing, rhonchi or rales.  Musculoskeletal:        General: Normal range of motion.  Skin:    General: Skin is warm and dry.  Neurological:     General: No focal deficit present.     Mental Status: She is alert and oriented to person, place, and time. Mental status is at baseline.  Psychiatric:        Mood and Affect: Mood normal.        Behavior: Behavior normal.        Thought Content: Thought content normal.        Judgment: Judgment normal.     Lab Results:  CBC    Component Value Date/Time   WBC 5.9 04/17/2017 1602   WBC 9.0 03/05/2013 1739   RBC 4.34 04/17/2017 1602   RBC 4.23 03/05/2013 1739   HGB 13.0 04/17/2017 1602   HCT 38.8 04/17/2017 1602   PLT 288 04/17/2017 1602   MCV 89 04/17/2017 1602   MCH 30.0 04/17/2017 1602   MCH 30.3 03/05/2013 1739   MCHC 33.5 04/17/2017 1602   MCHC 32.2 03/05/2013 1739   RDW 12.9 04/17/2017 1602   LYMPHSABS 1.9 04/17/2017 1602   EOSABS 0.3 04/17/2017 1602   BASOSABS 0.0 04/17/2017  1602    BMET    Component Value Date/Time   NA 141 04/17/2017 1602   K 4.3 04/17/2017 1602   CL 103 04/17/2017 1602   CO2 24 04/17/2017 1602   GLUCOSE 96 04/17/2017 1602   GLUCOSE 92 03/05/2013 1727   BUN 9 04/17/2017 1602   CREATININE 0.63 04/17/2017 1602   CREATININE 0.72 03/05/2013 1727   CALCIUM 9.2 04/17/2017 1602   GFRNONAA 108 04/17/2017 1602   GFRAA 124 04/17/2017 1602    BNP No results found for: BNP  ProBNP No results found for:  PROBNP  Imaging: No results found.   Assessment & Plan:   1. Snoring  2. OSA (obstructive sleep apnea) (Primary)  Assessment and Plan Assessment & Plan Moderate obstructive sleep apnea Diagnosed with moderate obstructive sleep apnea based on a recent sleep study showing an average of 17 apneic events per hour, with the lowest oxygen saturation at 75%. BMI 27. Previous sleep study in 2016 was negative for OSA. Considering alternative treatments to CPAP due to her preference. Discussed options including weight loss, oral appliance, and Inspire implant. CPAP remains the standard treatment but she is interested in alternatives. Zepbound  is approved for moderate or severe sleep apnea with BMI over 27, but insurance coverage is uncertain. Oral appliance involves out-of-pocket cost and requires dental referral. Inspire implant requires prior CPAP trial and is not first-line treatment. - Prescribe Zepbound  for weight loss to potentially improve sleep apnea. Start with 2.5 mg subcutaneous injection once a week for four weeks, then increase dose as tolerated to target dose 10-15mg  - Provide information on managing side effects of Zepbound , including dietary recommendations and potential use of Miralax for constipation. - Discuss potential out-of-pocket costs for Zepbound  and oral appliance, and insurance coverage challenges. - Consider referral to a dentist for an oral appliance if preferred. - Discuss potential referral to [facility] for additional weight loss options if needed.  Patient has moderate sleep apnea related to obesity with BMI >/30 or is overweight with BMI >/27, posing significant cardiovascular risks. Patient has failed traditional weight loss measures with diet and exercise for >/6 months. Patient will be initiated on Zepbound  (tirzepatide ) for weight management. Zepbound  is the only pharmaceutical treatment approved for moderate-to-severe OSA in adults who are overweight (BMI >/27) or obese (BMI >/30). The patient will continue lifestyle modifications, including structured nutrition and physical activity as directed. No other GLP1 therapy will be used simultaneously at this time. The patient does not have any FDA labeled contraindications to this agent, including pregnancy, lactation, hx or family history of medullary thyroid cancer, or multiple endocrine neoplasia type II. Side effect profile has been reviewed with patient. Aware of red flag symptoms to notify of immediately or seek emergency care, including severe nausea/vomiting, inability to pass bowels or gas, severe abdominal pain/tenderness, jaundice.    Obesity Obesity with a BMI over 27. Discussed weight loss as a potential treatment for moderate obstructive sleep apnea. Previous trial of phentermine was not successful. Zepbound  is the only medication approved for OSA and obesity with BMI over 27, but insurance coverage may be challenging. Side effects of Zepbound  include nausea, vomiting, diarrhea, and constipation. No routine lab work required unless diabetic. - Prescribe Zepbound  for weight loss, starting with 2.5 mg subcutaneous injection once a week for four weeks, then increase dose as tolerated. - Provide dietary recommendations to manage side effects and support weight loss, including small frequent meals, avoiding heavy and fatty foods, and ensuring adequate protein intake. - Discuss potential referral to [facility] for additional  weight loss options if needed.  Almarie LELON Ferrari, NP 03/14/2024

## 2024-03-14 NOTE — Patient Instructions (Signed)
 VISIT SUMMARY: Sheri Gray, a 53 year old female, came in for a follow-up after a sleep study confirmed moderate obstructive sleep apnea. She has a history of excessive snoring and apneas during sleep, with a recent weight gain of 10 to 15 pounds. The sleep study showed an average of 17 apneic events per hour and a low oxygen level of 75%.  YOUR PLAN: -MODERATE OBSTRUCTIVE SLEEP APNEA: Moderate obstructive sleep apnea means that you have frequent episodes during sleep where you stop breathing for 10 seconds or longer. We discussed several treatment options, including weight loss, an oral appliance, and the Inspire implant. While CPAP is the standard treatment, you are interested in alternatives. We will start you on Zepbound  for weight loss, with a dose of 2.5 mg as a weekly injection for four weeks, then increase the dose as tolerated. We also discussed managing side effects and potential costs. If medication is not covered by your insurance, you can consider self pay options, we can refer you to a dentist for an oral appliance or to [facility] for additional weight loss support.   -OBESITY: Obesity means having a body mass index (BMI) over 27, which can contribute to sleep apnea. We discussed weight loss as a treatment option. You will start Zepbound  for weight loss, with a dose of 2.5 mg as a weekly injection for four weeks, then increase the dose as tolerated. We provided dietary recommendations to help manage side effects and support weight loss. If needed, we can refer you to [facility] for additional weight loss options.  INSTRUCTIONS: Please follow up with us  after four weeks to assess your progress with Zepbound . If you experience any side effects or have concerns, contact our office. Consider scheduling a dental referral for an oral appliance if you prefer that option. Additionally, you may want to explore further weight loss support through [facility].  Follow-up 6 weeks with Beth NP or sooner if needed/ zepbound  follow-up    Tirzepatide  Injection (Weight Management) What is this medication? TIRZEPATIDE  (tir ZEP a tide) promotes weight loss. It may also be used to maintain weight loss.  It works by decreasing appetite. It can be used to treat sleep apnea. Changes to diet and exercise are often combined with this medication. This medicine may be used for other purposes; ask your health care provider or pharmacist if you have questions. COMMON BRAND NAME(S): Zepbound  What should I tell my care team before I take this medication? They need to know if you have any of these conditions: Diabetes Eye disease caused by diabetes Gallbladder disease Have or have had depression Have or have had pancreatitis Having surgery Kidney disease Personal or family history of MEN 2, a condition that causes endocrine gland tumors Personal or family history of thyroid cancer Stomach or intestine problems, such as problems digesting food Suicidal thoughts, plans, or attempt An unusual or allergic reaction to tirzepatide , other medications, foods, dyes, or preservatives Pregnant or trying to get pregnant Breastfeeding How should I use this medication? This medication is injected under the skin. You will be taught how to prepare and give it. Take it as directed on the prescription label. Keep taking it unless your care team tells you to stop. It is important that you put your used needles and syringes in a special sharps container. Do not put them in a trash can. If you do not have a sharps container, call your pharmacist or care team to get one. A special  MedGuide will be given to you by the pharmacist with each prescription and refill. Be sure to read this information carefully each time. This medication comes with INSTRUCTIONS FOR USE. Ask your pharmacist for directions on how to use this medication. Read the information carefully. Talk to your  pharmacist or care team if you have questions. Talk to your care team about the use of this medication in children. Special care may be needed. Overdosage: If you think you have taken too much of this medicine contact a poison control center or emergency room at once. NOTE: This medicine is only for you. Do not share this medicine with others. What if I miss a dose? If you miss a dose, take it as soon as you can unless it is more than 4 days (96 hours) late. If it is more than 4 days late, skip the missed dose. Take the next dose at the normal time. Do not take 2 doses within 3 days (72 hours) of each other. What may interact with this medication? Certain medications for diabetes, such as insulin, glyburide, glipizide This medication may affect how other medications work. Talk with your care team about all of the medications you take. They may suggest changes to your treatment plan to lower the risk of side effects and to make sure your medications work as intended. This list may not describe all possible interactions. Give your health care provider a list of all the medicines, herbs, non-prescription drugs, or dietary supplements you use. Also tell them if you smoke, drink alcohol, or use illegal drugs. Some items may interact with your medicine. What should I watch for while using this medication? Visit your care team for regular checks on your progress. Tell your care team if your condition does not start to get better or if it gets worse. Tell your care team if you are taking medication to treat diabetes, such as insulin or glipizide. This may increase your risk of low blood sugar. Know the symptoms of low blood sugar and how to treat it. Talk to your care team about your risk of cancer. You may be more at risk for certain types of cancer if you take this medication. Talk to your care team right away if you have a lump or swelling in your neck, hoarseness that does not go away, trouble swallowing,  shortness of breath, or trouble breathing. Make sure you stay hydrated while taking this medication. Drink water often. Eat fruits and veggies that have a high water content. Drink more water when it is hot or you are active. Talk to your care team right away if you have fever, infection, vomiting, diarrhea, or if you sweat a lot while taking this medication. The loss of too much body fluid may make it dangerous for you to take this medication. If you are going to need surgery or a procedure, tell your care team that you are taking this medication. Estrogen and progestin hormones that you take by mouth may not work as well while you are taking this medication. Switch to a non-oral contraceptive or add a barrier contraceptive for 4 weeks after starting this medication and after each dose increase. Talk to your care team about contraceptive options. They can help you find the option that works for you. Do not take this medication without first talking to your care team if you may be or could become pregnant. Your care team can help you find the option that works for you. Weight loss  is not recommended during pregnancy. Talk to your care team if you are breastfeeding. When recommended, this medication may be taken. Its use during breastfeeding has not been well studied. Your care team may suggest other options. What side effects may I notice from receiving this medication? Side effects that you should report to your care team as soon as possible: Allergic reactions--skin rash, itching, hives, swelling of the face, lips, tongue, or throat Change in vision Dehydration--increased thirst, dry mouth, feeling faint or lightheaded, headache, dark yellow or brown urine Fast or irregular heartbeat Gallbladder problems--severe stomach pain, nausea, vomiting, fever Kidney injury--decrease in the amount of urine, swelling of the ankles, hands, or feet Pancreatitis--severe stomach pain that spreads to your back or  gets worse after eating or when touched, fever, nausea, vomiting Thoughts of suicide or self-harm, worsening mood, feelings of depression Thyroid cancer--new mass or lump in the neck, pain or trouble swallowing, trouble breathing, hoarseness Side effects that usually do not require medical attention (report these to your care team if they continue or are bothersome): Constipation Diarrhea Loss of appetite Nausea Upset stomach This list may not describe all possible side effects. Call your doctor for medical advice about side effects. You may report side effects to FDA at 1-800-FDA-1088. Where should I keep my medication? Keep out of the reach of children and pets. Store in a refrigerator or at room temperature up to 30 degrees C (86 degrees F). Keep it in the original container. Protect from light. Refrigeration (preferred): Store in the refrigerator. Do not freeze. Get rid of any unused medication after the expiration date. Room temperature: This medication may be stored at room temperature for up to 21 days. If it is stored at room temperature, get rid of any unused medication after 21 days or after it expires, whichever is first. To get rid of medications that are no longer needed or have expired: Take the medication to a medication take-back program. Check with your pharmacy or law enforcement to find a location. If you cannot return the medication, ask your pharmacist or care team how to get rid of this medication safely. NOTE: This sheet is a summary. It may not cover all possible information. If you have questions about this medicine, talk to your doctor, pharmacist, or health care provider.  2025 Elsevier/Gold Standard (2023-07-20 00:00:00)

## 2024-03-15 ENCOUNTER — Telehealth: Payer: Self-pay | Admitting: Primary Care

## 2024-03-15 NOTE — Telephone Encounter (Signed)
 PT stopped in the office to inquire as to why her prescription for Zepbound  had not been called into her pharmacy.  Note - PT was seen yesterday

## 2024-03-17 ENCOUNTER — Encounter (HOSPITAL_BASED_OUTPATIENT_CLINIC_OR_DEPARTMENT_OTHER): Payer: Self-pay

## 2024-03-17 MED ORDER — ZEPBOUND 2.5 MG/0.5ML ~~LOC~~ SOAJ: 2.5000 mg | SUBCUTANEOUS | 2 refills | Status: DC

## 2024-03-17 NOTE — Telephone Encounter (Signed)
 Rx sent and pt notified in my chart message

## 2024-03-20 ENCOUNTER — Telehealth: Payer: Self-pay

## 2024-03-20 MED ORDER — ZEPBOUND 2.5 MG/0.5ML ~~LOC~~ SOAJ: 2.5000 mg | SUBCUTANEOUS | 2 refills | Status: AC

## 2024-03-20 NOTE — Telephone Encounter (Signed)
*  Pulm  Pharmacy Patient Advocate Encounter   Received notification from CoverMyMeds that prior authorization for Zepbound  2.5MG /0.5ML pen-injectors   is required/requested.   Insurance verification completed.   The patient is insured through [employer] .   Per test claim: PA required; PA started via CoverMyMeds. KEY [id number] . Waiting for clinical questions to populate.

## 2024-03-21 NOTE — Telephone Encounter (Signed)
 Effective October 1, [place] Medicaid will implement new changes to coverage for GLP-1 medications. At this time, Sheri Gray is the preferred product, even when treatment is related to obstructive sleep apnea (OSA). Under current policy, Zepbound  will not be covered unless the patient has tried and failed Wegovy.

## 2024-03-23 NOTE — Telephone Encounter (Signed)
 Please advise on Wegovy as Zepbound  is not approved

## 2024-03-29 ENCOUNTER — Ambulatory Visit: Admitting: Primary Care

## 2024-04-10 ENCOUNTER — Other Ambulatory Visit (HOSPITAL_COMMUNITY): Payer: Self-pay

## 2024-04-10 ENCOUNTER — Telehealth: Payer: Self-pay

## 2024-04-10 NOTE — Telephone Encounter (Signed)
 Pharmacy Patient Advocate Encounter  Received notification from [employer] that Prior Authorization for Zepbound  2.5MG /0.5ML pen-injectors   has been CANCELLED due to patient does not meet new Medicaid Zepbound  criteria.   *patient must have a baseline BMI >40

## 2024-04-12 NOTE — Telephone Encounter (Signed)
**Note De-identified  [place] Obfuscation** Please advise 

## 2024-04-12 NOTE — Telephone Encounter (Signed)
 Unfortunately there is nothing we can do regarding insurance coverage, she can consider self pay options which would be $499 per month and we would send RX [facility]. Otherwise follow-up with primary care to discuss alternative weight loss options

## 2024-04-17 NOTE — Telephone Encounter (Signed)
 Sent pt this communication via mychart

## 2024-05-01 ENCOUNTER — Ambulatory Visit: Admitting: Primary Care

## 2024-05-02 ENCOUNTER — Ambulatory Visit: Admitting: Primary Care

## 2024-05-23 ENCOUNTER — Ambulatory Visit: Admitting: Primary Care

## 2024-06-26 ENCOUNTER — Telehealth: Payer: Self-pay | Admitting: Primary Care

## 2024-06-26 NOTE — Telephone Encounter (Signed)
 Attempted to call patient. Left voicemail for patient to call office to get scheduled for follow up appointment with Almarie Ferrari NP.

## 2024-08-04 ENCOUNTER — Encounter: Payer: Self-pay | Admitting: Dermatology

## 2024-08-04 ENCOUNTER — Ambulatory Visit: Admitting: Dermatology

## 2024-08-04 DIAGNOSIS — L820 Inflamed seborrheic keratosis: Secondary | ICD-10-CM

## 2024-08-04 NOTE — Progress Notes (Signed)
" ° °  New Patient Visit   History of Present Illness Sheri Gray is a 54 year old female who presents with an itchy spot on her left shoulder.  She has experienced an intermittent itchy spot on her left shoulder for at least six months.  She has a history of skin lesions removed in the past, including one instance that required a deeper excision on her back. However, she has not sought any previous treatments for the current spot.  Pt has a growth on her left shoulder she'd like evaluated. Spot gets itchy at times and has been around for 6 mths. Family hx of MM  The following portions of the chart were reviewed this encounter and updated as appropriate: medications, allergies, medical history  Review of Systems:  No other skin or systemic complaints except as noted in HPI or Assessment and Plan.  Objective  Well appearing patient in no apparent distress; mood and affect are within normal limits.  A focused examination was performed of the following areas: Left shoulder   Relevant exam findings are noted in the Assessment and Plan.  Left Upper Back Inflamed stuck on papule   Assessment & Plan   Inflammed SEBORRHEIC KERATOSIS - Stuck-on, waxy, tan-brown papules and/or plaques  - Benign-appearing - Discussed benign etiology and prognosis. - Cryo Therapy Completed while in office today - Call for any changes INFLAMED SEBORRHEIC KERATOSIS Left Upper Back - Destruction of lesion - Left Upper Back Complexity: simple   Destruction method: cryotherapy   Informed consent: discussed and consent obtained   Post-procedure details: wound care instructions given     Return in 2 months (on 10/02/2024) for Spot Check with KP.  I, Darice Smock, CMA, am acting as scribe for RUFUS CHRISTELLA HOLY, MD.   Documentation: I have reviewed the above documentation for accuracy and completeness, and I agree with the above.  RUFUS CHRISTELLA HOLY, MD    "

## 2024-08-04 NOTE — Patient Instructions (Addendum)

## 2024-09-06 ENCOUNTER — Ambulatory Visit: Admitting: Dermatology
